# Patient Record
Sex: Female | Born: 1970 | Race: Black or African American | Hispanic: No | Marital: Married | Smoking: Never smoker
Health system: Southern US, Community
[De-identification: ages and names within clinical notes are randomized; demographics above are authoritative.]

## PROBLEM LIST (undated history)

## (undated) DIAGNOSIS — N200 Calculus of kidney: Secondary | ICD-10-CM

## (undated) DIAGNOSIS — R51 Headache: Secondary | ICD-10-CM

## (undated) DIAGNOSIS — N809 Endometriosis, unspecified: Secondary | ICD-10-CM

## (undated) DIAGNOSIS — K5900 Constipation, unspecified: Secondary | ICD-10-CM

## (undated) DIAGNOSIS — Z87442 Personal history of urinary calculi: Secondary | ICD-10-CM

## (undated) DIAGNOSIS — R519 Headache, unspecified: Secondary | ICD-10-CM

## (undated) DIAGNOSIS — E049 Nontoxic goiter, unspecified: Secondary | ICD-10-CM

## (undated) DIAGNOSIS — K635 Polyp of colon: Secondary | ICD-10-CM

## (undated) HISTORY — PX: ABDOMINAL HYSTERECTOMY: SHX81

## (undated) HISTORY — PX: FOOT SURGERY: SHX648

---
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
## 2005-09-18 ENCOUNTER — Ambulatory Visit: Payer: Self-pay | Admitting: Family Medicine

## 2006-10-01 ENCOUNTER — Ambulatory Visit: Payer: Self-pay | Admitting: Family Medicine

## 2007-04-03 ENCOUNTER — Other Ambulatory Visit: Payer: Self-pay

## 2007-04-03 ENCOUNTER — Emergency Department: Payer: Self-pay | Admitting: Emergency Medicine

## 2007-10-08 ENCOUNTER — Ambulatory Visit: Payer: Self-pay | Admitting: Family Medicine

## 2009-09-12 ENCOUNTER — Ambulatory Visit: Payer: Self-pay | Admitting: Unknown Physician Specialty

## 2010-10-25 ENCOUNTER — Ambulatory Visit: Payer: Self-pay | Admitting: Internal Medicine

## 2010-10-30 ENCOUNTER — Ambulatory Visit: Payer: Self-pay | Admitting: Unknown Physician Specialty

## 2011-06-24 ENCOUNTER — Ambulatory Visit: Payer: Self-pay | Admitting: Gastroenterology

## 2011-06-27 LAB — PATHOLOGY REPORT

## 2013-09-06 ENCOUNTER — Ambulatory Visit: Payer: Self-pay | Admitting: Internal Medicine

## 2013-09-09 ENCOUNTER — Ambulatory Visit (INDEPENDENT_AMBULATORY_CARE_PROVIDER_SITE_OTHER): Payer: 59 | Admitting: Pulmonary Disease

## 2013-09-09 ENCOUNTER — Encounter: Payer: Self-pay | Admitting: Pulmonary Disease

## 2013-09-09 ENCOUNTER — Encounter (INDEPENDENT_AMBULATORY_CARE_PROVIDER_SITE_OTHER): Payer: Self-pay

## 2013-09-09 VITALS — BP 114/70 | HR 74 | Temp 98.2°F | Ht 64.0 in | Wt 144.2 lb

## 2013-09-09 DIAGNOSIS — R911 Solitary pulmonary nodule: Secondary | ICD-10-CM | POA: Insufficient documentation

## 2013-09-09 NOTE — Patient Instructions (Signed)
Repeat CT scan in 1 yr Call us for breathing issues or cough or wheezing

## 2013-09-09 NOTE — Assessment & Plan Note (Addendum)
Low risk for malignancy in this never smoker, but will need serial followup. Repeat CT scan in 1 yr @ [place] [facility] Call us for breathing issues or cough or wheezing Her dyspnea seems to be related more to anxiety and depression and recent stressors rather than a cardiopulmonary issue.

## 2013-09-09 NOTE — Progress Notes (Signed)
   Subjective:    Patient ID: Kathy Jackson, female    DOB: 1970-11-14, 42 y.o.   MRN: [id number]  HPI 42 year old never smoker presents for evaluation of finding of pulmonary nodule on CT chest. She reports dyspnea unrelated to exertion for the past few months. This lasts for a few minutes and is relieved by taking deep breaths and relaxing. She reports frequent heartburn and has been maintained on Nexium for 2 years. She reports significant stressors in her marital life and is on Remeron for sleep issues for the past week. Chest x-ray was obtained which suggested right-sided lung nodule. Hence CT chest without contrast was obtained which showed a 4 mm nodule in the left lower lobe. The nodule noted on the chest x-ray appears to be artifactual. She denies chest pain palpitations, tremors, prior or family history of venous thromboembolic. Is no history of wheezing or childhood history of asthma.  No past medical history on file.  Past Surgical History  Procedure Laterality Date  . Abdominal hysterectomy    . Foot surgery      No Known Allergies  History   Social History  . Marital Status: Unknown    Spouse Name: N/A    Number of Children: 2  . Years of Education: N/A   Occupational History  . revenue and billing    Social History Main Topics  . Smoking status: Never Smoker   . Smokeless tobacco: Not on file  . Alcohol Use: No  . Drug Use: No  . Sexual Activity: Not on file   Other Topics Concern  . Not on file   Social History Narrative  . No narrative on file    Family History  Problem Relation Age of Onset  . Breast cancer Mother   . Prostate cancer Brother       Review of Systems  Constitutional: Positive for activity change and unexpected weight change. Negative for fever.  HENT: Negative for congestion, dental problem, ear pain, nosebleeds, postnasal drip, rhinorrhea, sinus pressure, sneezing, sore throat and trouble swallowing.   Eyes: Negative for  redness and itching.  Respiratory: Positive for shortness of breath. Negative for cough, chest tightness and wheezing.   Cardiovascular: Negative for palpitations and leg swelling.  Gastrointestinal: Negative for nausea and vomiting.  Genitourinary: Negative for dysuria.  Musculoskeletal: Negative for joint swelling.  Skin: Negative for rash.  Neurological: Negative for headaches.  Hematological: Does not bruise/bleed easily.  Psychiatric/Behavioral: Positive for dysphoric mood. The patient is not nervous/anxious.        Objective:   Physical Exam  Gen. Pleasant, well-nourished, in no distress ENT - no lesions, no post nasal drip Neck: No JVD, no thyromegaly, no carotid bruits Lungs: no use of accessory muscles, no dullness to percussion, clear without rales or rhonchi  Cardiovascular: Rhythm regular, heart sounds  normal, no murmurs or gallops, no peripheral edema Musculoskeletal: No deformities, no cyanosis or clubbing        Assessment & Plan:

## 2014-07-20 ENCOUNTER — Telehealth: Payer: Self-pay | Admitting: Pulmonary Disease

## 2014-07-20 NOTE — Telephone Encounter (Signed)
Spoke with patient-she is aware that I will get message to our PCC's to schedule her CT scan-order in EPIC.

## 2014-07-21 NOTE — Telephone Encounter (Signed)
LMTCB Kathy Jackson ° °

## 2014-07-21 NOTE — Telephone Encounter (Signed)
[email or web address]  09/27/14@3pm  pt is aware Kathy Jackson

## 2014-09-09 ENCOUNTER — Ambulatory Visit: Payer: Self-pay

## 2014-09-20 ENCOUNTER — Ambulatory Visit (INDEPENDENT_AMBULATORY_CARE_PROVIDER_SITE_OTHER): Payer: 59 | Admitting: Pulmonary Disease

## 2014-09-20 ENCOUNTER — Encounter: Payer: Self-pay | Admitting: Pulmonary Disease

## 2014-09-20 VITALS — BP 120/78 | HR 64 | Ht 64.0 in | Wt 159.6 lb

## 2014-09-20 DIAGNOSIS — R911 Solitary pulmonary nodule: Secondary | ICD-10-CM

## 2014-09-20 NOTE — Assessment & Plan Note (Signed)
Nodule is stable FU CT in dec 2016  

## 2014-09-20 NOTE — Progress Notes (Signed)
   Subjective:    Patient ID: Steffanie RainwaterNakethia N Lewman, female    DOB: 04/19/1971, 43 y.o.   MRN: [id number]  HPI  43 year old never smoker presents for follow-up of 4 mm left lower lobe nodule noted in December 2014  Her dyspnea symptoms have subsided, her stressors and anxiety have gone away. Follow-up CT chest [facility] )shows unchanged nodule     Review of Systems neg for any significant sore throat, dysphagia, itching, sneezing, nasal congestion or excess/ purulent secretions, fever, chills, sweats, unintended wt loss, pleuritic or exertional cp, hempoptysis, orthopnea pnd or change in chronic leg swelling. Also denies presyncope, palpitations, heartburn, abdominal pain, nausea, vomiting, diarrhea or change in bowel or urinary habits, dysuria,hematuria, rash, arthralgias, visual complaints, headache, numbness weakness or ataxia.     Objective:   Physical Exam  Gen. Pleasant, well-nourished, in no distress ENT - no lesions, no post nasal drip Neck: No JVD, no thyromegaly, no carotid bruits Lungs: no use of accessory muscles, no dullness to percussion, clear without rales or rhonchi  Cardiovascular: Rhythm regular, heart sounds  normal, no murmurs or gallops, no peripheral edema Musculoskeletal: No deformities, no cyanosis or clubbing        Assessment & Plan:

## 2014-09-20 NOTE — Patient Instructions (Signed)
Nodule is stable FU CT in dec 2016

## 2014-09-27 ENCOUNTER — Other Ambulatory Visit: Payer: 59

## 2014-10-03 ENCOUNTER — Ambulatory Visit: Payer: 59 | Admitting: Pulmonary Disease

## 2014-10-06 ENCOUNTER — Other Ambulatory Visit: Payer: 59

## 2015-03-06 ENCOUNTER — Other Ambulatory Visit: Payer: Self-pay | Admitting: Internal Medicine

## 2015-03-06 ENCOUNTER — Ambulatory Visit
Admission: RE | Admit: 2015-03-06 | Discharge: 2015-03-06 | Disposition: A | Discharge status: Home / Self Care | Payer: 59 | Source: Ambulatory Visit | Attending: Internal Medicine | Admitting: Internal Medicine

## 2015-03-06 DIAGNOSIS — R0602 Shortness of breath: Secondary | ICD-10-CM

## 2015-03-06 DIAGNOSIS — R911 Solitary pulmonary nodule: Secondary | ICD-10-CM | POA: Diagnosis not present

## 2015-03-06 MED ORDER — IOHEXOL 350 MG/ML SOLN
75.0000 mL | Freq: Once | INTRAVENOUS | Status: AC | PRN
  Administered 2015-03-06: 75 mL via INTRAVENOUS

## 2015-11-08 ENCOUNTER — Ambulatory Visit (INDEPENDENT_AMBULATORY_CARE_PROVIDER_SITE_OTHER): Payer: 59 | Admitting: Pulmonary Disease

## 2015-11-08 ENCOUNTER — Encounter: Payer: Self-pay | Admitting: Pulmonary Disease

## 2015-11-08 VITALS — BP 120/64 | HR 76 | Ht 64.0 in | Wt 160.6 lb

## 2015-11-08 DIAGNOSIS — R911 Solitary pulmonary nodule: Secondary | ICD-10-CM

## 2015-11-08 NOTE — Progress Notes (Signed)
   Subjective:    Patient ID: Kathy Jackson, female    DOB: 1971-05-28, 45 y.o.   MRN: [id number]  HPI  45 year old never smoker presents for follow-up of 4 mm left lower lobe nodule noted in December 2014   11/08/2015  Chief Complaint  Patient presents with  . Follow-up    pt. states breathing is baseline.denies SOB, cough, wheezing or chest pain/tightness.   Her dyspnea symptoms have subsided, her stressors and anxiety have gone away. Follow-up CT chest [facility] ) 02/2015 showed unchanged nodule Reflux symptoms are controlled on Nexium  Review of Systems neg for any significant sore throat, dysphagia, itching, sneezing, nasal congestion or excess/ purulent secretions, fever, chills, sweats, unintended wt loss, pleuritic or exertional cp, hempoptysis, orthopnea pnd or change in chronic leg swelling. Also denies presyncope, palpitations, heartburn, abdominal pain, nausea, vomiting, diarrhea or change in bowel or urinary habits, dysuria,hematuria, rash, arthralgias, visual complaints, headache, numbness weakness or ataxia.     Objective:   Physical Exam  Gen. Pleasant, well-nourished, in no distress ENT - no lesions, no post nasal drip Neck: No JVD, no thyromegaly, no carotid bruits Lungs: no use of accessory muscles, no dullness to percussion, clear without rales or rhonchi  Cardiovascular: Rhythm regular, heart sounds  normal, no murmurs or gallops, no peripheral edema Musculoskeletal: No deformities, no cyanosis or clubbing        Assessment & Plan:

## 2015-11-08 NOTE — Assessment & Plan Note (Signed)
Schedule CT chest - to follow-up nodule Favor benign etiology  Greater than 50% time was spent in counseling and coordination of care with the patient

## 2015-11-08 NOTE — Patient Instructions (Signed)
Ct chest will be scheduled at [facility]

## 2015-11-13 ENCOUNTER — Ambulatory Visit
Admission: RE | Admit: 2015-11-13 | Discharge: 2015-11-13 | Disposition: A | Discharge status: Home / Self Care | Payer: 59 | Source: Ambulatory Visit | Attending: Pulmonary Disease | Admitting: Pulmonary Disease

## 2015-11-13 DIAGNOSIS — R911 Solitary pulmonary nodule: Secondary | ICD-10-CM

## 2015-12-22 ENCOUNTER — Other Ambulatory Visit: Payer: Self-pay | Admitting: Internal Medicine

## 2015-12-22 DIAGNOSIS — R0789 Other chest pain: Secondary | ICD-10-CM

## 2016-01-03 ENCOUNTER — Ambulatory Visit
Admission: RE | Admit: 2016-01-03 | Discharge: 2016-01-03 | Disposition: A | Discharge status: Home / Self Care | Payer: 59 | Source: Ambulatory Visit | Attending: Internal Medicine | Admitting: Internal Medicine

## 2016-01-03 DIAGNOSIS — K219 Gastro-esophageal reflux disease without esophagitis: Secondary | ICD-10-CM | POA: Diagnosis not present

## 2016-01-03 DIAGNOSIS — R0789 Other chest pain: Secondary | ICD-10-CM | POA: Diagnosis not present

## 2016-02-01 ENCOUNTER — Other Ambulatory Visit: Payer: Self-pay | Admitting: Unknown Physician Specialty

## 2016-02-01 DIAGNOSIS — Z1231 Encounter for screening mammogram for malignant neoplasm of breast: Secondary | ICD-10-CM

## 2016-02-08 ENCOUNTER — Ambulatory Visit: Payer: 59

## 2016-03-12 ENCOUNTER — Ambulatory Visit: Payer: 59

## 2016-03-15 ENCOUNTER — Ambulatory Visit
Admission: RE | Admit: 2016-03-15 | Discharge: 2016-03-15 | Disposition: A | Discharge status: Home / Self Care | Payer: 59 | Source: Ambulatory Visit | Attending: Unknown Physician Specialty | Admitting: Unknown Physician Specialty

## 2016-03-15 ENCOUNTER — Other Ambulatory Visit: Payer: Self-pay | Admitting: Unknown Physician Specialty

## 2016-03-15 DIAGNOSIS — Z1231 Encounter for screening mammogram for malignant neoplasm of breast: Secondary | ICD-10-CM | POA: Diagnosis present

## 2016-06-13 ENCOUNTER — Encounter: Payer: Self-pay | Admitting: *Deleted

## 2016-06-14 ENCOUNTER — Encounter: Payer: Self-pay | Admitting: Anesthesiology

## 2016-06-14 ENCOUNTER — Encounter
Admission: RE | Disposition: A | Discharge status: Home / Self Care | Payer: Self-pay | Source: Ambulatory Visit | Attending: Gastroenterology

## 2016-06-14 ENCOUNTER — Ambulatory Visit: Payer: 59 | Admitting: Anesthesiology

## 2016-06-14 ENCOUNTER — Ambulatory Visit
Admission: RE | Admit: 2016-06-14 | Discharge: 2016-06-14 | Disposition: A | Discharge status: Home / Self Care | Payer: 59 | Source: Ambulatory Visit | Attending: Gastroenterology | Admitting: Gastroenterology

## 2016-06-14 DIAGNOSIS — K2210 Ulcer of esophagus without bleeding: Secondary | ICD-10-CM | POA: Diagnosis not present

## 2016-06-14 DIAGNOSIS — K210 Gastro-esophageal reflux disease with esophagitis: Secondary | ICD-10-CM | POA: Insufficient documentation

## 2016-06-14 DIAGNOSIS — Z79899 Other long term (current) drug therapy: Secondary | ICD-10-CM | POA: Diagnosis not present

## 2016-06-14 DIAGNOSIS — E049 Nontoxic goiter, unspecified: Secondary | ICD-10-CM | POA: Diagnosis not present

## 2016-06-14 DIAGNOSIS — K2950 Unspecified chronic gastritis without bleeding: Secondary | ICD-10-CM | POA: Insufficient documentation

## 2016-06-14 DIAGNOSIS — Z87442 Personal history of urinary calculi: Secondary | ICD-10-CM | POA: Diagnosis not present

## 2016-06-14 HISTORY — DX: Headache: R51

## 2016-06-14 HISTORY — PX: ESOPHAGOGASTRODUODENOSCOPY (EGD) WITH PROPOFOL: SHX5813

## 2016-06-14 HISTORY — DX: Calculus of kidney: N20.0

## 2016-06-14 HISTORY — DX: Nontoxic goiter, unspecified: E04.9

## 2016-06-14 HISTORY — DX: Headache, unspecified: R51.9

## 2016-06-14 HISTORY — DX: Endometriosis, unspecified: N80.9

## 2016-06-14 HISTORY — DX: Constipation, unspecified: K59.00

## 2016-06-14 SURGERY — ESOPHAGOGASTRODUODENOSCOPY (EGD) WITH PROPOFOL
Anesthesia: General

## 2016-06-14 MED ORDER — SODIUM CHLORIDE 0.9 % IV SOLN
INTRAVENOUS | Status: DC
  Administered 2016-06-14: 1000 mL via INTRAVENOUS

## 2016-06-14 MED ORDER — PROPOFOL 500 MG/50ML IV EMUL
INTRAVENOUS | Status: DC | PRN
  Administered 2016-06-14: 140 ug/kg/min via INTRAVENOUS

## 2016-06-14 MED ORDER — FENTANYL CITRATE (PF) 100 MCG/2ML IJ SOLN
INTRAMUSCULAR | Status: DC | PRN
  Administered 2016-06-14: 50 ug via INTRAVENOUS

## 2016-06-14 MED ORDER — MIDAZOLAM HCL 5 MG/5ML IJ SOLN
INTRAMUSCULAR | Status: DC | PRN
  Administered 2016-06-14: 1 mg via INTRAVENOUS

## 2016-06-14 MED ORDER — PROPOFOL 10 MG/ML IV BOLUS
INTRAVENOUS | Status: DC | PRN
  Administered 2016-06-14: 50 mg via INTRAVENOUS

## 2016-06-14 MED ORDER — SODIUM CHLORIDE 0.9 % IV SOLN: INTRAVENOUS | Status: DC

## 2016-06-14 NOTE — H&P (Signed)
Outpatient short stay form Pre-procedure 06/14/2016 10:55 AM Christena DeemMartin U Zuleyka Kloc MD  Primary Physician: Dr. Aram BeechamJeffrey Sparks  Reason for visit:  EGD  History of present illness:  Patient is a 45 year old female presenting today as above. She has a long history of heartburn and reflux issues. She states that times she will reflux at night and awaken with material on the pillow. She was previously taking Nexium which did not seem to help her symptoms. She is now on Protonix 40 mg twice a day with relatively good symptom resolution. She is presenting today for evaluation. She denies dysphagia. She did have a upper GI with small bowel series on 01/03/2016 the showing a severe gastroesophageal reflux and a normal small bowel evaluation. She denies use of aspirin products. She does take an occasional Aleve. She takes no blood thinning agents.    Current Facility-Administered Medications:  .  0.9 %  sodium chloride infusion, , Intravenous, Continuous, Christena DeemMartin U Micheline Markes, MD, Last Rate: 20 mL/hr at 06/14/16 1028, 1,000 mL at 06/14/16 1028 .  0.9 %  sodium chloride infusion, , Intravenous, Continuous, Christena DeemMartin U Emnet Monk, MD  Prescriptions Prior to Admission  Medication Sig Dispense Refill Last Dose  . Cholecalciferol (VITAMIN D-3) 5000 UNITS TABS Take by mouth daily.   Past Week at Unknown time  . esomeprazole (NEXIUM) 40 MG capsule Take 40 mg by mouth daily at 12 noon.   Not Taking at Unknown time  . pantoprazole (PROTONIX) 40 MG tablet Take 40 mg by mouth 2 (two) times daily.   Not Taking at Unknown time  . phentermine 37.5 MG capsule Take 37.5 mg by mouth every morning.   Not Taking at Unknown time     No Known Allergies   Past Medical History:  Diagnosis Date  . Constipation   . Endometriosis   . Goiter   . Headache   . Nephrolithiasis     Review of systems:      Physical Exam    Heart and lungs: Regular rate and rhythm without rub or gallop, lungs are bilaterally clear.    HEENT:  Septic atraumatic eyes are anicteric    Other:     Pertinant exam for procedure: Soft nontender nondistended bowel sounds positive normoactive.    Planned proceedures: EGD and indicated procedures. I have discussed the risks benefits and complications of procedures to include not limited to bleeding, infection, perforation and the risk of sedation and the patient wishes to proceed.    Christena DeemMartin U Kourtlyn Charlet, MD Gastroenterology 06/14/2016  10:55 AM

## 2016-06-14 NOTE — Anesthesia Postprocedure Evaluation (Signed)
Anesthesia Post Note  Patient: Steffanie Rainwaterakethia N Melman  Procedure(s) Performed: Procedure(s) (LRB): ESOPHAGOGASTRODUODENOSCOPY (EGD) WITH PROPOFOL (N/A)  Patient location during evaluation: Endoscopy Anesthesia Type: General Level of consciousness: awake and alert Pain management: pain level controlled Vital Signs Assessment: post-procedure vital signs reviewed and stable Respiratory status: spontaneous breathing and respiratory function stable Cardiovascular status: stable Anesthetic complications: no    Last Vitals:  Vitals:   06/14/16 1129 06/14/16 1139  BP: 107/60 111/75  Pulse: 72   Resp: 15   Temp:      Last Pain:  Vitals:   06/14/16 1119  TempSrc: Tympanic                 KEPHART,WILLIAM K

## 2016-06-14 NOTE — Anesthesia Preprocedure Evaluation (Signed)
Anesthesia Evaluation  Patient identified by MRN, date of birth, ID band Patient awake    Reviewed: Allergy & Precautions, H&P , NPO status , Patient's Chart, lab work & pertinent test results, reviewed documented beta blocker date and time   History of Anesthesia Complications Negative for: history of anesthetic complications  Airway Mallampati: I  TM Distance: >3 FB Neck ROM: full    Dental no notable dental hx.    Pulmonary neg pulmonary ROS,    Pulmonary exam normal breath sounds clear to auscultation       Cardiovascular Exercise Tolerance: Good negative cardio ROS Normal cardiovascular exam Rhythm:regular Rate:Normal     Neuro/Psych negative neurological ROS  negative psych ROS   GI/Hepatic Neg liver ROS, GERD  ,  Endo/Other  negative endocrine ROS  Renal/GU Renal disease (kidney stones)  negative genitourinary   Musculoskeletal   Abdominal   Peds  Hematology negative hematology ROS (+)   Anesthesia Other Findings Past Medical History: No date: Constipation No date: Endometriosis No date: Goiter No date: Headache No date: Nephrolithiasis   Reproductive/Obstetrics negative OB ROS                             Anesthesia Physical Anesthesia Plan  ASA: II  Anesthesia Plan: General   Post-op Pain Management:    Induction:   Airway Management Planned:   Additional Equipment:   Intra-op Plan:   Post-operative Plan:   Informed Consent: I have reviewed the patients History and Physical, chart, labs and discussed the procedure including the risks, benefits and alternatives for the proposed anesthesia with the patient or authorized representative who has indicated his/her understanding and acceptance.   Dental Advisory Given  Plan Discussed with: Anesthesiologist, CRNA and Surgeon  Anesthesia Plan Comments:         Anesthesia Quick Evaluation

## 2016-06-14 NOTE — Op Note (Signed)
[facility] Gastroenterology Patient Name: Kathy Jackson Procedure Date: 06/14/2016 10:53 AM MRN: [id number] Account #: [id number] Date of Birth: Feb 01, 1971 Admit Type: Outpatient Age: 45 Room: [facility] ENDO ROOM 3 Gender: Female Note Status: Finalized Procedure:            Upper GI endoscopy Indications:          Epigastric abdominal pain, Gastro-esophageal reflux                        disease Providers:            Christena Deem, MD Referring MD:         Duane Lope. Judithann Sheen, MD (Referring MD) Medicines:            Monitored Anesthesia Care Complications:        No immediate complications. Procedure:            Pre-Anesthesia Assessment:                       - ASA Grade Assessment: II - A patient with mild                        systemic disease.                       After obtaining informed consent, the endoscope was                        passed under direct vision. Throughout the procedure,                        the patient's blood pressure, pulse, and oxygen                        saturations were monitored continuously. The Endoscope                        was introduced through the mouth, and advanced to the                        third part of duodenum. The upper GI endoscopy was                        accomplished without difficulty. The patient tolerated                        the procedure well. Findings:      LA Grade A (one or more mucosal breaks less than 5 mm, not extending       between tops of 2 mucosal folds) esophagitis with no bleeding was found.       Biopsies were taken with a cold forceps for histology.      The exam of the esophagus was otherwise normal.      Patchy mild inflammation characterized by erosions and erythema was       found in the gastric antrum. Biopsies were taken with a cold forceps for       histology.      The cardia and gastric fundus were normal on retroflexion.      The examined duodenum was normal.      The  exam was otherwise without abnormality.  Impression:           - LA Grade A erosive esophagitis. Biopsied.                       - Erosive gastritis. Biopsied.                       - Normal examined duodenum.                       - The examination was otherwise normal. Recommendation:       - Await pathology results.                       - Continue present medications.                       - Return to GI clinic in 1 month.                       - consider abdominal ultrasound/RUQ with HIDA/CCK for                        continued symptoms. Procedure Code(s):    --- Professional ---                       [phone], Esophagogastroduodenoscopy, flexible, transoral;                        with biopsy, single or multiple Diagnosis Code(s):    --- Professional ---                       K20.8, Other esophagitis                       K29.60, Other gastritis without bleeding                       R10.13, Epigastric pain                       K21.9, Gastro-esophageal reflux disease without                        esophagitis CPT copyright 2016 American Medical Association. All rights reserved. The codes documented in this report are preliminary and upon coder review may  be revised to meet current compliance requirements. Christena DeemMartin U Skulskie, MD 06/14/2016 11:20:38 AM This report has been signed electronically. Number of Addenda: 0 Note Initiated On: 06/14/2016 10:53 AM      [facility]

## 2016-06-14 NOTE — Anesthesia Procedure Notes (Signed)
Date/Time: 06/14/2016 11:01 AM Performed by: Derinda LateIACONE, Franklin Baumbach Pre-anesthesia Checklist: Timeout performed, Patient being monitored, Suction available, Emergency Drugs available and Patient identified Patient Re-evaluated:Patient Re-evaluated prior to inductionOxygen Delivery Method: Nasal cannula Intubation Type: IV induction Placement Confirmation: positive ETCO2

## 2016-06-14 NOTE — Transfer of Care (Signed)
Immediate Anesthesia Transfer of Care Note  Patient: Kathy Jackson  Procedure(s) Performed: Procedure(s): ESOPHAGOGASTRODUODENOSCOPY (EGD) WITH PROPOFOL (N/A)  Patient Location: PACU and Endoscopy Unit  Anesthesia Type:General  Level of Consciousness: awake, alert  and oriented  Airway & Oxygen Therapy: Patient Spontanous Breathing and Patient connected to nasal cannula oxygen  Post-op Assessment: Report given to RN and Post -op Vital signs reviewed and stable  Post vital signs: Reviewed  Last Vitals:  Vitals:   06/14/16 1018 06/14/16 1119  BP: (!) 114/36   Pulse: 62   Resp: 16   Temp: 36.6 C (!) 35.8 C    Last Pain:  Vitals:   06/14/16 1119  TempSrc: Tympanic         Complications: No apparent anesthesia complications

## 2016-06-17 ENCOUNTER — Encounter: Payer: Self-pay | Admitting: Gastroenterology

## 2016-06-17 LAB — SURGICAL PATHOLOGY

## 2016-08-21 ENCOUNTER — Telehealth: Payer: Self-pay | Admitting: Pulmonary Disease

## 2016-08-21 NOTE — Telephone Encounter (Signed)
No further follow up required.

## 2016-08-21 NOTE — Telephone Encounter (Signed)
Pt called asking about whether or not she needs another f/u ct chest  I advised that it looks like her scan in Feb 2017 should be the last one, since it was stable for 3 yrs and likely benign so no scan should be needed now I advised will double check with RA, and if he states otherwise, we will let her know  RA, please advise thanks!

## 2016-08-21 NOTE — Telephone Encounter (Signed)
Called and spoke to pt. Informed her of the recs per RA. Pt verbalized understanding and denied any further questions or concerns at this time.   

## 2016-08-26 ENCOUNTER — Ambulatory Visit: Payer: 59

## 2016-11-07 ENCOUNTER — Ambulatory Visit: Payer: 59 | Admitting: Pulmonary Disease

## 2016-11-07 ENCOUNTER — Encounter: Payer: Self-pay | Admitting: *Deleted

## 2016-11-07 ENCOUNTER — Emergency Department: Payer: 59

## 2016-11-07 ENCOUNTER — Emergency Department
Admission: EM | Admit: 2016-11-07 | Discharge: 2016-11-07 | Disposition: A | Discharge status: Home / Self Care | Payer: 59 | Attending: Emergency Medicine | Admitting: Emergency Medicine

## 2016-11-07 DIAGNOSIS — R0602 Shortness of breath: Secondary | ICD-10-CM | POA: Insufficient documentation

## 2016-11-07 DIAGNOSIS — Z79899 Other long term (current) drug therapy: Secondary | ICD-10-CM | POA: Diagnosis not present

## 2016-11-07 DIAGNOSIS — R0789 Other chest pain: Secondary | ICD-10-CM | POA: Diagnosis not present

## 2016-11-07 DIAGNOSIS — R42 Dizziness and giddiness: Secondary | ICD-10-CM | POA: Insufficient documentation

## 2016-11-07 LAB — CBC
HCT: 37.2 % (ref 35.0–47.0)
Hemoglobin: 11.9 g/dL — ABNORMAL LOW (ref 12.0–16.0)
MCH: 23.7 pg — ABNORMAL LOW (ref 26.0–34.0)
MCHC: 32.1 g/dL (ref 32.0–36.0)
MCV: 73.9 fL — ABNORMAL LOW (ref 80.0–100.0)
PLATELETS: 256 10*3/uL (ref 150–440)
RBC: 5.04 MIL/uL (ref 3.80–5.20)
RDW: 17.5 % — ABNORMAL HIGH (ref 11.5–14.5)
WBC: 15.5 10*3/uL — AB (ref 3.6–11.0)

## 2016-11-07 LAB — BASIC METABOLIC PANEL
Anion gap: 10 (ref 5–15)
BUN: 20 mg/dL (ref 6–20)
CHLORIDE: 102 mmol/L (ref 101–111)
CO2: 25 mmol/L (ref 22–32)
CREATININE: 1 mg/dL (ref 0.44–1.00)
Calcium: 9 mg/dL (ref 8.9–10.3)
Glucose, Bld: 91 mg/dL (ref 65–99)
Potassium: 2.8 mmol/L — ABNORMAL LOW (ref 3.5–5.1)
SODIUM: 137 mmol/L (ref 135–145)

## 2016-11-07 LAB — TROPONIN I

## 2016-11-07 MED ORDER — IPRATROPIUM-ALBUTEROL 0.5-2.5 (3) MG/3ML IN SOLN
3.0000 mL | Freq: Once | RESPIRATORY_TRACT | Status: AC
Start: 2016-11-07 — End: 2016-11-07
  Administered 2016-11-07: 3 mL via RESPIRATORY_TRACT
  Filled 2016-11-07: qty 3

## 2016-11-07 MED ORDER — ALBUTEROL SULFATE HFA 108 (90 BASE) MCG/ACT IN AERS: 2.0000 | INHALATION_SPRAY | Freq: Four times a day (QID) | RESPIRATORY_TRACT | 0 refills | Status: AC | PRN

## 2016-11-07 MED ORDER — POTASSIUM CHLORIDE CRYS ER 20 MEQ PO TBCR
40.0000 meq | EXTENDED_RELEASE_TABLET | Freq: Once | ORAL | Status: AC
  Administered 2016-11-07: 40 meq via ORAL
  Filled 2016-11-07: qty 2

## 2016-11-07 NOTE — ED Notes (Signed)
ED Provider at bedside. 

## 2016-11-07 NOTE — ED Triage Notes (Signed)
States SOB, states swelling in her legs and feet, states she is currently on predisone for a chest cold, pt awake and alert in no acute distress

## 2016-11-07 NOTE — ED Provider Notes (Signed)
[facility] Emergency Department Provider Note  ____________________________________________   I have reviewed the triage vital signs and the nursing notes.   HISTORY  Chief Complaint Shortness of Breath   History limited by: Not Limited   HPI Kathy Jackson is a 46 y.o. female who presents to the emergency department today because of concerns for shortness of breath, chest pressure and lightheadedness. Patient states that she is being treated by her primary care doctor for chest congestion. This includes being placed on now her second round of steroids as well as doxycycline. Patient states today while she is driving she started feeling a little lightheaded. She felt like the shortness of breath was a little worse. In addition the patient felt lightheaded. She states that by the time of my evaluation the lightheadedness had improved. She is also complaining of swelling in her legs.   Past Medical History:  Diagnosis Date  . Constipation   . Endometriosis   . Goiter   . Headache   . Nephrolithiasis     Patient Active Problem List   Diagnosis Date Noted  . Solitary pulmonary nodule 09/09/2013    Past Surgical History:  Procedure Laterality Date  . ABDOMINAL HYSTERECTOMY    . ESOPHAGOGASTRODUODENOSCOPY (EGD) WITH PROPOFOL N/A 06/14/2016   Procedure: ESOPHAGOGASTRODUODENOSCOPY (EGD) WITH PROPOFOL;  Surgeon: Christena Deem, MD;  Location: [facility] ENDOSCOPY;  Service: Endoscopy;  Laterality: N/A;  . FOOT SURGERY      Prior to Admission medications   Medication Sig Start Date End Date Taking? Authorizing Provider  Cholecalciferol (VITAMIN D-3) 5000 UNITS TABS Take by mouth daily.    Historical Provider, MD  esomeprazole (NEXIUM) 40 MG capsule Take 40 mg by mouth daily at 12 noon.    Historical Provider, MD  pantoprazole (PROTONIX) 40 MG tablet Take 40 mg by mouth 2 (two) times daily.    Historical Provider, MD  phentermine 37.5 MG capsule Take 37.5  mg by mouth every morning.    Historical Provider, MD    Allergies Patient has no known allergies.  Family History  Problem Relation Age of Onset  . Breast cancer Mother   . Prostate cancer Brother     Social History Social History  Substance Use Topics  . Smoking status: Never Smoker  . Smokeless tobacco: Never Used  . Alcohol use No    Review of Systems  Constitutional: Negative for fever. Cardiovascular: Positive for chest pain. Respiratory: Positive for shortness of breath. Gastrointestinal: Negative for abdominal pain, vomiting and diarrhea. Genitourinary: Negative for dysuria. Musculoskeletal: Negative for back pain. Positive for leg swelling. Skin: Negative for rash. Neurological: Negative for headaches, focal weakness or numbness.  10-point ROS otherwise negative.  ____________________________________________   PHYSICAL EXAM:  VITAL SIGNS: ED Triage Vitals [11/07/16 1734]  Enc Vitals Group     BP 131/64     Pulse Rate 66     Resp 18     Temp 98.9 F (37.2 C)     Temp Source Oral     SpO2 100 %     Weight 154 lb (69.9 kg)     Height 5\' 4"  (1.626 m)     Head Circumference     Constitutional: Alert and oriented. Well appearing and in no distress. Eyes: Conjunctivae are normal. Normal extraocular movements. ENT   Head: Normocephalic and atraumatic.   Nose: No congestion/rhinnorhea.   Mouth/Throat: Mucous membranes are moist.   Neck: No stridor. Hematological/Lymphatic/Immunilogical: No cervical lymphadenopathy. Cardiovascular: Normal rate, regular  rhythm.  No murmurs, rubs, or gallops. Respiratory: Normal respiratory effort without tachypnea nor retractions. Breath sounds are clear and equal bilaterally. No wheezes/rales/rhonchi. Gastrointestinal: Soft and non tender. No rebound. No guarding.  Genitourinary: Deferred Musculoskeletal: Normal range of motion in all extremities. No lower extremity edema. Neurologic:  Normal speech and  language. No gross focal neurologic deficits are appreciated.  Skin:  Skin is warm, dry and intact. No rash noted. Psychiatric: Mood and affect are normal. Speech and behavior are normal. Patient exhibits appropriate insight and judgment.  ____________________________________________    LABS (pertinent positives/negatives)  Labs Reviewed  BASIC METABOLIC PANEL - Abnormal; Notable for the following:       Result Value   Potassium 2.8 (*)    All other components within normal limits  CBC - Abnormal; Notable for the following:    WBC 15.5 (*)    Hemoglobin 11.9 (*)    MCV 73.9 (*)    MCH 23.7 (*)    RDW 17.5 (*)    All other components within normal limits  TROPONIN I    ____________________________________________   EKG  I, Phineas SemenGraydon Shawnique Mariotti, attending physician, personally viewed and interpreted this EKG  EKG Time: 1740 Rate: 65 Rhythm: normal sinus rhythm Axis: normal Intervals: qtc 422 QRS: incomplete RBBB ST changes: no st elevation Impression: abnormal ekg   ____________________________________________    RADIOLOGY  CXR IMPRESSION: No active cardiopulmonary disease.   ____________________________________________   PROCEDURES  Procedures  ____________________________________________   INITIAL IMPRESSION / ASSESSMENT AND PLAN / ED COURSE  Pertinent labs & imaging results that were available during my care of the patient were reviewed by me and considered in my medical decision making (see chart for details).  She presented to the emergency department today with concerns for some lower extremity edema, lightheadedness shortness breath and chest pain. Patient is being treated for chest cold with steroids. Think that likely explains some of the swelling. Patient was given duoneb treatment which she stated helped with her symptoms. Will plan on discharging with albuterol inhaler.   ____________________________________________   FINAL CLINICAL  IMPRESSION(S) / ED DIAGNOSES  Final diagnoses:  SOB (shortness of breath)     Note: This dictation was prepared with Dragon dictation. Any transcriptional errors that result from this process are unintentional     Phineas SemenGraydon Sheketa Ende, MD 11/07/16 2109

## 2016-11-07 NOTE — Discharge Instructions (Signed)
Please seek medical attention for any high fevers, chest pain, shortness of breath, change in behavior, persistent vomiting, bloody stool or any other new or concerning symptoms.  

## 2016-11-15 ENCOUNTER — Ambulatory Visit: Payer: 59 | Admitting: Pulmonary Disease

## 2016-11-21 DIAGNOSIS — K635 Polyp of colon: Secondary | ICD-10-CM

## 2016-11-21 HISTORY — DX: Polyp of colon: K63.5

## 2016-11-27 ENCOUNTER — Encounter: Payer: Self-pay | Admitting: Pulmonary Disease

## 2016-11-27 ENCOUNTER — Ambulatory Visit (INDEPENDENT_AMBULATORY_CARE_PROVIDER_SITE_OTHER): Payer: 59 | Admitting: Pulmonary Disease

## 2016-11-27 DIAGNOSIS — R911 Solitary pulmonary nodule: Secondary | ICD-10-CM | POA: Diagnosis not present

## 2016-11-27 DIAGNOSIS — J209 Acute bronchitis, unspecified: Secondary | ICD-10-CM | POA: Diagnosis not present

## 2016-11-27 NOTE — Assessment & Plan Note (Signed)
She does not have a history of asthma. Current symptoms seem to have been related to an acute attack of bronchitis which is now resolved She  can stop taking Symbicort

## 2016-11-27 NOTE — Progress Notes (Signed)
   Subjective:    Patient ID: Kathy Jackson, female    DOB: 09/10/1971, 46 y.o.   MRN: [id number]  HPI  46 year old never smoker  for follow-up of 4 mm left lower lobe nodule noted in December 2014 She works in the coding department [employer]  11/27/2016  Chief Complaint  Patient presents with  . Follow-up    1 year follow up.    She had a chest cold in February and went through 2 rounds of prednisone. She developed acute shortness of breath while driving in mid February and had to go to [facility] ED where she was given duo nebs with relief. She was then prescribed Symbicort by her PCP which she is taking now. Her cough and wheezing has subsided  She denies childhood history of asthma, she is a lifetime never smoker She has a lot of anxiety and stressors at one time but this is better although somewhat ongoing. Reflux symptoms are well controlled on Protonix I personally reviewed chest x-ray from 10/2016 which does not show any nodules    Review of Systems neg for any significant sore throat, dysphagia, itching, sneezing, nasal congestion or excess/ purulent secretions, fever, chills, sweats, unintended wt loss, pleuritic or exertional cp, hempoptysis, orthopnea pnd or change in chronic leg swelling. Also denies presyncope, palpitations, heartburn, abdominal pain, nausea, vomiting, diarrhea or change in bowel or urinary habits, dysuria,hematuria, rash, arthralgias, visual complaints, headache, numbness weakness or ataxia.     Objective:   Physical Exam  Gen. Pleasant, well-nourished, in no distress ENT - no lesions, no post nasal drip Neck: No JVD, no thyromegaly, no carotid bruits Lungs: no use of accessory muscles, no dullness to percussion, clear without rales or rhonchi  Cardiovascular: Rhythm regular, heart sounds  normal, no murmurs or gallops, no peripheral edema Musculoskeletal: No deformities, no cyanosis or clubbing         Assessment & Plan:

## 2016-11-27 NOTE — Assessment & Plan Note (Signed)
Nodule has been stable and is benign and does not need more follow-up

## 2016-11-27 NOTE — Patient Instructions (Signed)
Nodule has been stable and is benign and does not need more follow-up  You can stop taking Symbicort after you're done with your current inhaler  Call us as needed

## 2016-12-12 ENCOUNTER — Encounter: Payer: Self-pay | Admitting: *Deleted

## 2016-12-13 ENCOUNTER — Encounter
Admission: RE | Disposition: A | Discharge status: Home / Self Care | Payer: Self-pay | Source: Ambulatory Visit | Attending: Gastroenterology

## 2016-12-13 ENCOUNTER — Ambulatory Visit
Admission: RE | Admit: 2016-12-13 | Discharge: 2016-12-13 | Disposition: A | Discharge status: Home / Self Care | Payer: 59 | Source: Ambulatory Visit | Attending: Gastroenterology | Admitting: Gastroenterology

## 2016-12-13 ENCOUNTER — Ambulatory Visit: Payer: 59 | Admitting: *Deleted

## 2016-12-13 ENCOUNTER — Encounter: Payer: Self-pay | Admitting: *Deleted

## 2016-12-13 DIAGNOSIS — Z1211 Encounter for screening for malignant neoplasm of colon: Secondary | ICD-10-CM | POA: Insufficient documentation

## 2016-12-13 DIAGNOSIS — K635 Polyp of colon: Secondary | ICD-10-CM | POA: Insufficient documentation

## 2016-12-13 DIAGNOSIS — Z8371 Family history of colonic polyps: Secondary | ICD-10-CM | POA: Insufficient documentation

## 2016-12-13 DIAGNOSIS — K5730 Diverticulosis of large intestine without perforation or abscess without bleeding: Secondary | ICD-10-CM | POA: Diagnosis not present

## 2016-12-13 DIAGNOSIS — Z79899 Other long term (current) drug therapy: Secondary | ICD-10-CM | POA: Diagnosis not present

## 2016-12-13 HISTORY — DX: Personal history of urinary calculi: Z87.442

## 2016-12-13 HISTORY — PX: COLONOSCOPY WITH PROPOFOL: SHX5780

## 2016-12-13 SURGERY — COLONOSCOPY WITH PROPOFOL
Anesthesia: General

## 2016-12-13 MED ORDER — SODIUM CHLORIDE 0.9 % IV SOLN: INTRAVENOUS | Status: DC

## 2016-12-13 MED ORDER — SODIUM CHLORIDE 0.9 % IV SOLN
INTRAVENOUS | Status: DC
  Administered 2016-12-13: 10:00:00 via INTRAVENOUS

## 2016-12-13 MED ORDER — PROPOFOL 10 MG/ML IV BOLUS
INTRAVENOUS | Status: AC
  Filled 2016-12-13: qty 20

## 2016-12-13 MED ORDER — PROPOFOL 500 MG/50ML IV EMUL
INTRAVENOUS | Status: DC | PRN
  Administered 2016-12-13: 150 ug/kg/min via INTRAVENOUS

## 2016-12-13 MED ORDER — EPHEDRINE SULFATE 50 MG/ML IJ SOLN
INTRAMUSCULAR | Status: DC | PRN
  Administered 2016-12-13: 10 mg via INTRAVENOUS

## 2016-12-13 MED ORDER — PROPOFOL 500 MG/50ML IV EMUL
INTRAVENOUS | Status: AC
  Filled 2016-12-13: qty 50

## 2016-12-13 NOTE — Anesthesia Preprocedure Evaluation (Signed)
Anesthesia Evaluation  Patient identified by MRN, date of birth, ID band Patient awake    Reviewed: Allergy & Precautions, H&P , NPO status , Patient's Chart, lab work & pertinent test results, reviewed documented beta blocker date and time   Airway Mallampati: II   Neck ROM: full    Dental  (+) Teeth Intact   Pulmonary neg pulmonary ROS, neg shortness of breath, asthma ,    Pulmonary exam normal        Cardiovascular Exercise Tolerance: Good (-) CAD negative cardio ROS Normal cardiovascular exam Rhythm:regular Rate:Normal  Able to walk a mile without difficulty.  Has a EST pending as part of a workup secondary to a recent bout of dyspnea that is being treated as asthma and is without recurrence since starting her inhalers.  JA   Neuro/Psych  Headaches, negative neurological ROS  negative psych ROS   GI/Hepatic negative GI ROS, Neg liver ROS,   Endo/Other  negative endocrine ROS  Renal/GU Renal diseasenegative Renal ROS  negative genitourinary   Musculoskeletal   Abdominal   Peds  Hematology negative hematology ROS (+)   Anesthesia Other Findings Past Medical History: No date: Constipation No date: Endometriosis No date: Goiter No date: Headache     Comment: Migraines No date: History of kidney stones No date: Nephrolithiasis Past Surgical History: No date: ABDOMINAL HYSTERECTOMY 06/14/2016: ESOPHAGOGASTRODUODENOSCOPY (EGD) WITH PROPOFOL N/A     Comment: Procedure: ESOPHAGOGASTRODUODENOSCOPY (EGD)               WITH PROPOFOL;  Surgeon: Christena DeemMartin U Skulskie, MD;              Location: [facility] ENDOSCOPY;  Service: Endoscopy;               Laterality: N/A; No date: FOOT SURGERY BMI    Body Mass Index:  30.36 kg/m     Reproductive/Obstetrics negative OB ROS                             Anesthesia Physical Anesthesia Plan  ASA: II  Anesthesia Plan: General   Post-op Pain Management:     Induction:   Airway Management Planned:   Additional Equipment:   Intra-op Plan:   Post-operative Plan:   Informed Consent: I have reviewed the patients History and Physical, chart, labs and discussed the procedure including the risks, benefits and alternatives for the proposed anesthesia with the patient or authorized representative who has indicated his/her understanding and acceptance.   Dental Advisory Given  Plan Discussed with: CRNA  Anesthesia Plan Comments:         Anesthesia Quick Evaluation

## 2016-12-13 NOTE — Transfer of Care (Signed)
Immediate Anesthesia Transfer of Care Note  Patient: Steffanie RainwaterNakethia N Certain  Procedure(s) Performed: Procedure(s): COLONOSCOPY WITH PROPOFOL (N/A)  Patient Location: PACU  Anesthesia Type:General  Level of Consciousness: awake, alert  and oriented  Airway & Oxygen Therapy: Patient Spontanous Breathing and Patient connected to nasal cannula oxygen  Post-op Assessment: Report given to RN and Post -op Vital signs reviewed and stable  Post vital signs: Reviewed and stable  Last Vitals:  Vitals:   12/13/16 0938  BP: 109/69  Pulse: 82  Resp: 18  Temp: 36.6 C    Last Pain:  Vitals:   12/13/16 0938  TempSrc: Tympanic         Complications: No apparent anesthesia complications

## 2016-12-13 NOTE — Op Note (Addendum)
[facility] Gastroenterology Patient Name: Kathy Jackson Procedure Date: 12/13/2016 9:43 AM MRN: [id number] Account #: [id number] Date of Birth: [birth year] Admit Type: Outpatient Age: 46 Room: [facility] ENDO ROOM 3 Gender: Female Note Status: Finalized Procedure:            Colonoscopy Indications:          Family history of colonic polyps in a first-degree                        relative Providers:            Christena Deem, MD Medicines:            Monitored Anesthesia Care Complications:        No immediate complications. Procedure:            Pre-Anesthesia Assessment:                       - ASA Grade Assessment: II - A patient with mild                        systemic disease.                       After obtaining informed consent, the colonoscope was                        passed under direct vision. Throughout the procedure,                        the patient's blood pressure, pulse, and oxygen                        saturations were monitored continuously. The                        Colonoscope was introduced through the anus and                        advanced to the the cecum, identified by appendiceal                        orifice and ileocecal valve. The colonoscopy was                        performed without difficulty. The patient tolerated the                        procedure well. The quality of the bowel preparation                        was good. Findings:      A 1 mm polyp was found in the proximal transverse colon. The polyp was       sessile. The polyp was removed with a cold biopsy forceps. Resection and       retrieval were complete.      The retroflexed view of the distal rectum and anal verge was normal and       showed no anal or rectal abnormalities.      A few small-mouthed diverticula were found in the sigmoid colon and       descending  colon.      The digital rectal exam was normal.      Of note there is an extrinsic  compression of the base of the cecum. This       could possibly be an ovary or other structure. Impression:           - One 1 mm polyp in the proximal transverse colon,                        removed with a cold biopsy forceps. Resected and                        retrieved.                       - The distal rectum and anal verge are normal on                        retroflexion view.                       - Diverticulosis in the sigmoid colon and in the                        descending colon. Recommendation:       - Discharge patient to home.                       - Repeat colonoscopy in 5 years for screening purposes.                       - Perform a CT scan (computed tomography) of abdomen                        with contrast and pelvis with contrast at appointment                        to be scheduled for abnormal finding on colonoscopy,                        possible extrinsic compression of the cecum. Procedure Code(s):    --- Professional ---                       [id number], Colonoscopy, flexible; with biopsy, single or                        multiple Diagnosis Code(s):    --- Professional ---                       D12.3, Benign neoplasm of transverse colon (hepatic                        flexure or splenic flexure)                       Z83.71, Family history of colonic polyps                       K57.30, Diverticulosis of large intestine without  perforation or abscess without bleeding CPT copyright 2016 American Medical Association. All rights reserved. The codes documented in this report are preliminary and upon coder review may  be revised to meet current compliance requirements. Christena DeemMartin U Deepak Bless, MD 12/13/2016 10:50:31 AM This report has been signed electronically. Number of Addenda: 0 Note Initiated On: 12/13/2016 9:43 AM Scope Withdrawal Time: 0 hours 5 minutes 36 seconds  Total Procedure Duration: 0 hours 30 minutes 1 second       [facility]  [facility]

## 2016-12-13 NOTE — H&P (Signed)
Outpatient short stay form Pre-procedure 12/13/2016 9:58 AM Christena DeemMartin U Nanci Lakatos MD  Primary Physician: Dr. Aram BeechamJeffrey Sparks  Reason for visit:  Colonoscopy  History of present illness:  Patient is a 46 year old female presenting today as above. There is family history of colon polyps in her brother. She tolerated her prep well. She takes 81 mg aspirin but hasn't for at least a month. She takes no other aspirin products or blood thinning agents. This is her first colonoscopy.    Current Facility-Administered Medications:  .  0.9 %  sodium chloride infusion, , Intravenous, Continuous, Christena DeemMartin U Emylia Latella, MD .  0.9 %  sodium chloride infusion, , Intravenous, Continuous, Christena DeemMartin U Lander Eslick, MD  Prescriptions Prior to Admission  Medication Sig Dispense Refill Last Dose  . Cholecalciferol (VITAMIN D-3) 5000 UNITS TABS Take by mouth daily.   Past Week at Unknown time  . fluticasone (FLONASE) 50 MCG/ACT nasal spray Place 2 sprays into both nostrils daily.   Past Week at Unknown time  . pantoprazole (PROTONIX) 40 MG tablet Take 40 mg by mouth daily.   Past Week at Unknown time  . phentermine 37.5 MG capsule Take 37.5 mg by mouth every morning.   Past Week at Unknown time  . albuterol (PROVENTIL HFA;VENTOLIN HFA) 108 (90 Base) MCG/ACT inhaler Inhale 2 puffs into the lungs every 6 (six) hours as needed for wheezing or shortness of breath. 1 Inhaler 0 Taking     No Known Allergies   Past Medical History:  Diagnosis Date  . Constipation   . Endometriosis   . Goiter   . Headache    Migraines  . History of kidney stones   . Nephrolithiasis     Review of systems:      Physical Exam    Heart and lungs: Regular rate and rhythm without rub or gallop, lungs are bilaterally clear.    HEENT: Normocephalic atraumatic eyes are anicteric    Other:     Pertinant exam for procedure: Soft nontender nondistended bowel sounds positive normoactive.    Planned proceedures: Colonoscopy and indicated  procedures. I have discussed the risks benefits and complications of procedures to include not limited to bleeding, infection, perforation and the risk of sedation and the patient wishes to proceed.    Christena DeemMartin U Arnitra Sokoloski, MD Gastroenterology 12/13/2016  9:58 AM

## 2016-12-13 NOTE — Anesthesia Post-op Follow-up Note (Cosign Needed)
Anesthesia QCDR form completed.        

## 2016-12-16 ENCOUNTER — Other Ambulatory Visit: Payer: Self-pay | Admitting: Gastroenterology

## 2016-12-16 ENCOUNTER — Encounter: Payer: Self-pay | Admitting: Gastroenterology

## 2016-12-16 DIAGNOSIS — R102 Pelvic and perineal pain: Secondary | ICD-10-CM

## 2016-12-16 DIAGNOSIS — R1084 Generalized abdominal pain: Secondary | ICD-10-CM

## 2016-12-16 DIAGNOSIS — R933 Abnormal findings on diagnostic imaging of other parts of digestive tract: Secondary | ICD-10-CM

## 2016-12-16 NOTE — Anesthesia Postprocedure Evaluation (Signed)
Anesthesia Post Note  Patient: Kathy Jackson  Procedure(s) Performed: Procedure(s) (LRB): COLONOSCOPY WITH PROPOFOL (N/A)  Patient location during evaluation: PACU Anesthesia Type: General Level of consciousness: awake and alert Pain management: pain level controlled Vital Signs Assessment: post-procedure vital signs reviewed and stable Respiratory status: spontaneous breathing, nonlabored ventilation, respiratory function stable and patient connected to nasal cannula oxygen Cardiovascular status: blood pressure returned to baseline and stable Postop Assessment: no signs of nausea or vomiting Anesthetic complications: no     Last Vitals:  Vitals:   12/13/16 1120 12/13/16 1130  BP: (!) 112/56 114/66  Pulse: 75 78  Resp: (!) 23 20  Temp:      Last Pain:  Vitals:   12/14/16 1612  TempSrc:   PainSc: 0-No pain                 Yevette EdwardsJames G Leyah Bocchino

## 2016-12-17 LAB — SURGICAL PATHOLOGY

## 2016-12-23 ENCOUNTER — Ambulatory Visit
Admission: RE | Admit: 2016-12-23 | Discharge: 2016-12-23 | Disposition: A | Discharge status: Home / Self Care | Payer: 59 | Source: Ambulatory Visit | Attending: Gastroenterology | Admitting: Gastroenterology

## 2016-12-23 DIAGNOSIS — R1084 Generalized abdominal pain: Secondary | ICD-10-CM | POA: Diagnosis present

## 2016-12-23 DIAGNOSIS — R933 Abnormal findings on diagnostic imaging of other parts of digestive tract: Secondary | ICD-10-CM

## 2016-12-23 DIAGNOSIS — Z90712 Acquired absence of cervix with remaining uterus: Secondary | ICD-10-CM | POA: Diagnosis not present

## 2016-12-23 DIAGNOSIS — R102 Pelvic and perineal pain: Secondary | ICD-10-CM | POA: Insufficient documentation

## 2016-12-23 DIAGNOSIS — N83201 Unspecified ovarian cyst, right side: Secondary | ICD-10-CM | POA: Insufficient documentation

## 2016-12-23 MED ORDER — IOPAMIDOL (ISOVUE-300) INJECTION 61%
100.0000 mL | Freq: Once | INTRAVENOUS | Status: AC | PRN
  Administered 2016-12-23: 100 mL via INTRAVENOUS

## 2016-12-24 ENCOUNTER — Ambulatory Visit: Payer: 59

## 2017-01-29 ENCOUNTER — Other Ambulatory Visit: Payer: Self-pay | Admitting: Internal Medicine

## 2017-01-29 DIAGNOSIS — R0602 Shortness of breath: Secondary | ICD-10-CM

## 2017-02-21 ENCOUNTER — Ambulatory Visit
Admission: RE | Admit: 2017-02-21 | Discharge: 2017-02-21 | Disposition: A | Discharge status: Home / Self Care | Payer: 59 | Source: Ambulatory Visit | Attending: Internal Medicine | Admitting: Internal Medicine

## 2017-02-21 DIAGNOSIS — R0602 Shortness of breath: Secondary | ICD-10-CM | POA: Insufficient documentation

## 2017-02-21 DIAGNOSIS — E041 Nontoxic single thyroid nodule: Secondary | ICD-10-CM | POA: Insufficient documentation

## 2017-02-21 DIAGNOSIS — R911 Solitary pulmonary nodule: Secondary | ICD-10-CM | POA: Insufficient documentation

## 2017-02-21 MED ORDER — IOPAMIDOL (ISOVUE-370) INJECTION 76%
75.0000 mL | Freq: Once | INTRAVENOUS | Status: AC | PRN
  Administered 2017-02-21: 75 mL via INTRAVENOUS

## 2017-03-10 ENCOUNTER — Ambulatory Visit (INDEPENDENT_AMBULATORY_CARE_PROVIDER_SITE_OTHER): Payer: 59 | Admitting: Obstetrics and Gynecology

## 2017-03-10 ENCOUNTER — Encounter: Payer: Self-pay | Admitting: Obstetrics and Gynecology

## 2017-03-10 VITALS — BP 102/62 | HR 62 | Ht 64.0 in | Wt 172.0 lb

## 2017-03-10 DIAGNOSIS — Z01419 Encounter for gynecological examination (general) (routine) without abnormal findings: Secondary | ICD-10-CM | POA: Diagnosis not present

## 2017-03-10 DIAGNOSIS — Z1231 Encounter for screening mammogram for malignant neoplasm of breast: Secondary | ICD-10-CM | POA: Diagnosis not present

## 2017-03-10 DIAGNOSIS — N83201 Unspecified ovarian cyst, right side: Secondary | ICD-10-CM

## 2017-03-10 DIAGNOSIS — Z1239 Encounter for other screening for malignant neoplasm of breast: Secondary | ICD-10-CM

## 2017-03-10 DIAGNOSIS — Z803 Family history of malignant neoplasm of breast: Secondary | ICD-10-CM | POA: Diagnosis not present

## 2017-03-10 NOTE — Progress Notes (Signed)
Chief Complaint  Patient presents with  . Gynecologic Exam     HPI:      Ms. Kathy Jackson is a 46 y.o. No obstetric history on file. who LMP was No LMP recorded. Patient has had a hysterectomy., presents today for her annual examination.  Her menses are absent due to TAH for endometriosis. Dysmenorrhea none. She does not have intermenstrual bleeding. She does have vasomotor sx that are mostly tolerable. Pt cont to have RLQ pain mid cycle. Sx have been going on for years, but pt was found to have a RTO cyst on CT scan 4/18. (CT scan report reviewed and showed 2.4 cm RTO cyst and 1.8 cm hemorrhagic RTO cyst.)  Sex activity: single partner, contraception - status post hysterectomy.  Last Pap: December 08, 2014  Results were: no abnormalities /neg HPV DNA  Hx of STDs: none  Last mammogram: March 15, 2016  Results were: normal--routine follow-up in 12 months There is a FH of breast cancer in her mom, genetic testing not indicated. There is no FH of ovarian cancer. The patient does do self-breast exams.  Tobacco use: The patient denies current or previous tobacco use. Alcohol use: none Exercise: moderately active  She does get adequate calcium and Vitamin D in her diet.    Past Medical History:  Diagnosis Date  . Constipation   . Endometriosis   . Goiter   . Headache    Migraines  . History of kidney stones   . Nephrolithiasis     Past Surgical History:  Procedure Laterality Date  . ABDOMINAL HYSTERECTOMY    . COLONOSCOPY WITH PROPOFOL N/A 12/13/2016   Procedure: COLONOSCOPY WITH PROPOFOL;  Surgeon: Christena Deem, MD;  Location[facility] ENDOSCOPY;  Service: Endoscopy;  Laterality: N/A;  . ESOPHAGOGASTRODUODENOSCOPY (EGD) WITH PROPOFOL N/A 06/14/2016   Procedure: ESOPHAGOGASTRODUODENOSCOPY (EGD) WITH PROPOFOL;  Surgeon: Christena Deem, MD;  Location: [facility] ENDOSCOPY;  Service: Endoscopy;  Laterality: N/A;  . FOOT SURGERY      Family History  Problem Relation Age of Onset   . Breast cancer Mother   . Prostate cancer Brother     Social History   Social History  . Marital status: Married    Spouse name: N/A  . Number of children: 2  . Years of education: N/A   Occupational History  . revenue and billing    Social History Main Topics  . Smoking status: Never Smoker  . Smokeless tobacco: Never Used  . Alcohol use No  . Drug use: No  . Sexual activity: Yes    Birth control/ protection: Surgical   Other Topics Concern  . Not on file   Social History Narrative  . No narrative on file     Current Outpatient Prescriptions:  .  budesonide-formoterol (SYMBICORT) 160-4.5 MCG/ACT inhaler, Inhale into the lungs., Disp: , Rfl:  .  albuterol (PROVENTIL HFA;VENTOLIN HFA) 108 (90 Base) MCG/ACT inhaler, Inhale 2 puffs into the lungs every 6 (six) hours as needed for wheezing or shortness of breath., Disp: 1 Inhaler, Rfl: 0 .  Cholecalciferol (VITAMIN D-3) 5000 UNITS TABS, Take by mouth daily., Disp: , Rfl:  .  fluticasone (FLONASE) 50 MCG/ACT nasal spray, Place 2 sprays into both nostrils daily., Disp: , Rfl:  .  pantoprazole (PROTONIX) 40 MG tablet, Take 40 mg by mouth daily., Disp: , Rfl:  .  phentermine 37.5 MG capsule, Take 37.5 mg by mouth every morning., Disp: , Rfl:   ROS:  Review of  Systems  Constitutional: Positive for fatigue. Negative for fever and unexpected weight change.  Respiratory: Positive for shortness of breath. Negative for cough and wheezing.   Cardiovascular: Negative for chest pain, palpitations and leg swelling.  Gastrointestinal: Negative for blood in stool, constipation, diarrhea, nausea and vomiting.  Endocrine: Negative for cold intolerance, heat intolerance and polyuria.  Genitourinary: Negative for dyspareunia, dysuria, flank pain, frequency, genital sores, hematuria, menstrual problem, pelvic pain, urgency, vaginal bleeding, vaginal discharge and vaginal pain.  Musculoskeletal: Positive for arthralgias. Negative for back  pain, joint swelling and myalgias.  Skin: Negative for rash.  Neurological: Positive for headaches. Negative for dizziness, syncope, light-headedness and numbness.  Hematological: Negative for adenopathy.  Psychiatric/Behavioral: Negative for agitation, confusion, sleep disturbance and suicidal ideas. The patient is nervous/anxious.      Objective: BP 102/62   Pulse 62   Ht 5\' 4"  (1.626 m)   Wt 172 lb (78 kg)   BMI 29.52 kg/m    Physical Exam  Constitutional: She is oriented to person, place, and time. She appears well-developed and well-nourished.  Genitourinary: Vagina normal. There is no rash or tenderness on the right labia. There is no rash or tenderness on the left labia. No erythema or tenderness in the vagina. No vaginal discharge found. Right adnexum does not display mass and does not display tenderness. Left adnexum does not display mass and does not display tenderness.  Genitourinary Comments: UTERUS/CX SURG REM  Neck: Normal range of motion. No thyromegaly present.  Cardiovascular: Normal rate, regular rhythm and normal heart sounds.   No murmur heard. Pulmonary/Chest: Effort normal and breath sounds normal. Right breast exhibits no mass, no nipple discharge, no skin change and no tenderness. Left breast exhibits no mass, no nipple discharge, no skin change and no tenderness.  Abdominal: Soft. There is no tenderness. There is no guarding.  Musculoskeletal: Normal range of motion.  Neurological: She is alert and oriented to person, place, and time. No cranial nerve deficit.  Psychiatric: She has a normal mood and affect. Her behavior is normal.  Vitals reviewed.    Assessment/Plan: Encounter for annual routine gynecological examination  Screening for breast cancer - Pt to sched mammo. - Plan: MM SCREENING BREAST TOMO BILATERAL  Family history of breast cancer - Plan: MM SCREENING BREAST TOMO BILATERAL  Cyst of right ovary - On CT scan. Check GYN u/s for f/u. Will  call pt with results. - Plan: US Transvaginal Non-OB             GYN counsel mammography screening, menopause, adequate intake of calcium and vitamin D, diet and exercise     F/U  Return in about 2 days (around 03/12/2017) for GYN u/s for RTO cyst--[facility] to call pt.  Briceson Broadwater B. Yuleimy Kretz, PA-C 03/10/2017 4:00 PM

## 2017-03-12 ENCOUNTER — Other Ambulatory Visit: Payer: 59

## 2017-03-14 ENCOUNTER — Ambulatory Visit (INDEPENDENT_AMBULATORY_CARE_PROVIDER_SITE_OTHER): Payer: 59

## 2017-03-14 DIAGNOSIS — N83201 Unspecified ovarian cyst, right side: Secondary | ICD-10-CM

## 2017-03-14 NOTE — Progress Notes (Signed)
Review of ULTRASOUND.    I have personally reviewed images and [facility]    Plan of management to be discussed with patient.  Annamarie MajorPaul Tequia Wolman, MD, Merlinda FrederickFACOG [facility], [facility] 03/14/2017  11:41 AM

## 2017-03-17 ENCOUNTER — Telehealth: Payer: Self-pay | Admitting: Obstetrics and Gynecology

## 2017-03-17 NOTE — Telephone Encounter (Signed)
Pt aware of resolved ovar cysts on u/s (2 RTO cysts on 4/18 CT scan), and now with bilat follicles. Mid cycle pain could be mittleschmertz. NSAIDs. Also has hx of endometriosis. F/u prn.

## 2017-04-21 ENCOUNTER — Encounter: Payer: Self-pay | Admitting: Obstetrics and Gynecology

## 2017-04-21 ENCOUNTER — Ambulatory Visit
Admission: RE | Admit: 2017-04-21 | Discharge: 2017-04-21 | Disposition: A | Discharge status: Home / Self Care | Payer: 59 | Source: Ambulatory Visit | Attending: Obstetrics and Gynecology | Admitting: Obstetrics and Gynecology

## 2017-04-21 DIAGNOSIS — Z803 Family history of malignant neoplasm of breast: Secondary | ICD-10-CM

## 2017-04-21 DIAGNOSIS — Z1231 Encounter for screening mammogram for malignant neoplasm of breast: Secondary | ICD-10-CM | POA: Insufficient documentation

## 2017-04-21 DIAGNOSIS — Z1239 Encounter for other screening for malignant neoplasm of breast: Secondary | ICD-10-CM

## 2018-01-10 IMAGING — CT CT ABD-PELV W/ CM
1 of 3 series · 14 of 32 positions shown, 19 images · IV contrast (APPLIED)
Comparison: None.

CLINICAL DATA: Abnormal colonoscopy. Right-sided pelvic pain.
Abdominal pain.

EXAM:
CT ABDOMEN AND PELVIS WITH CONTRAST
TECHNIQUE: Multidetector CT imaging of the abdomen and pelvis was performed
using the standard protocol following bolus administration of
intravenous contrast.
CONTRAST:  100mL LNE4AQ-T44 IOPAMIDOL (LNE4AQ-T44) INJECTION 61%

[Series 2: axial st · axial · 0.64mm/px · z∈[-881,-471]mm · 14 of 92 slices shown, 19 images]
[im 5/92  soft-tissue]
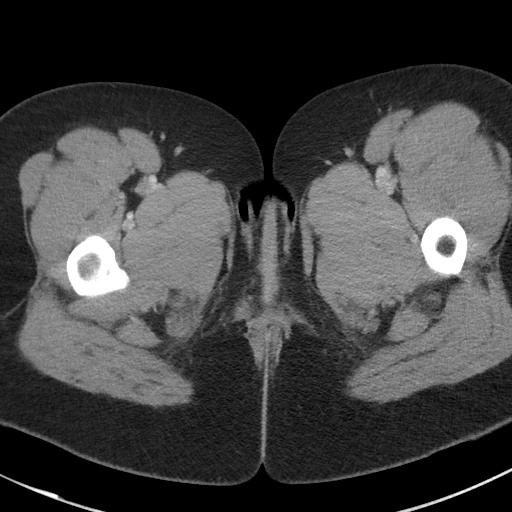
[im 5/92  bone]
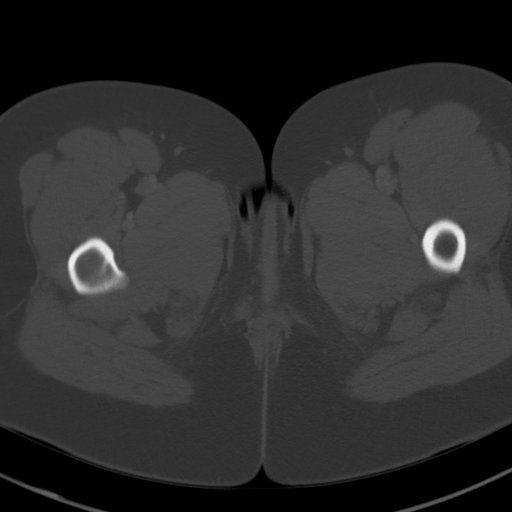
[im 14/92  soft-tissue]
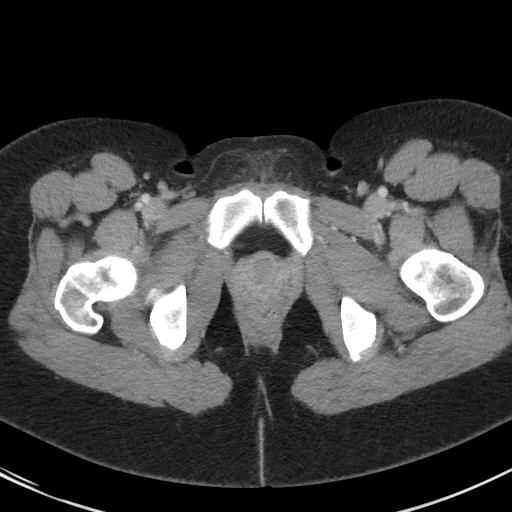
[im 19/92  soft-tissue]
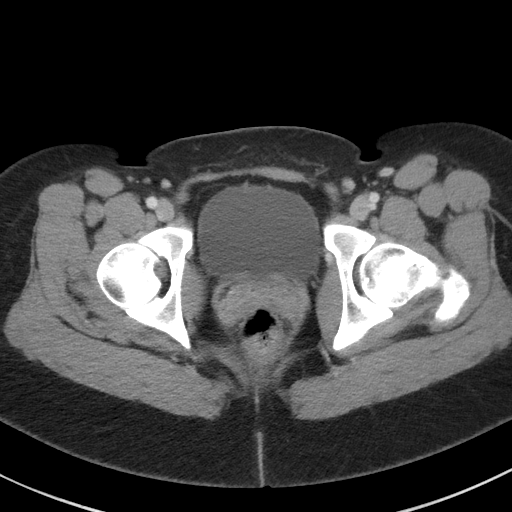
[im 28/92  soft-tissue]
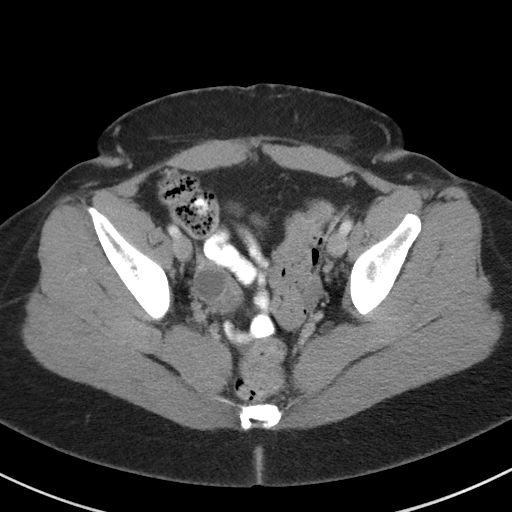
[im 32/92  soft-tissue]
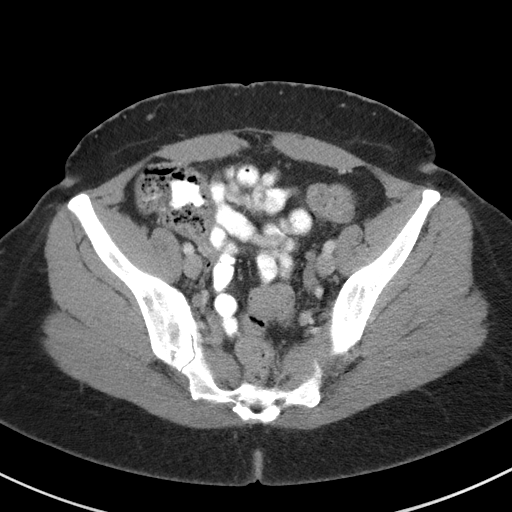
[im 41/92  soft-tissue]
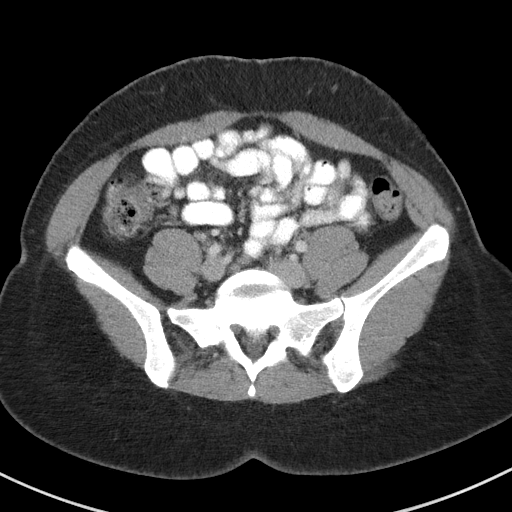
[im 46/92  soft-tissue]
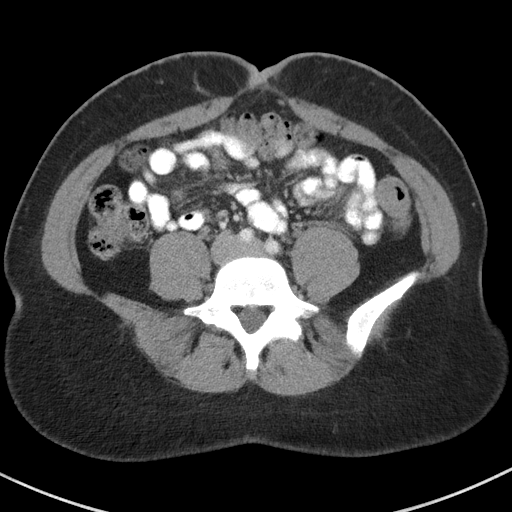
[im 51/92  soft-tissue]
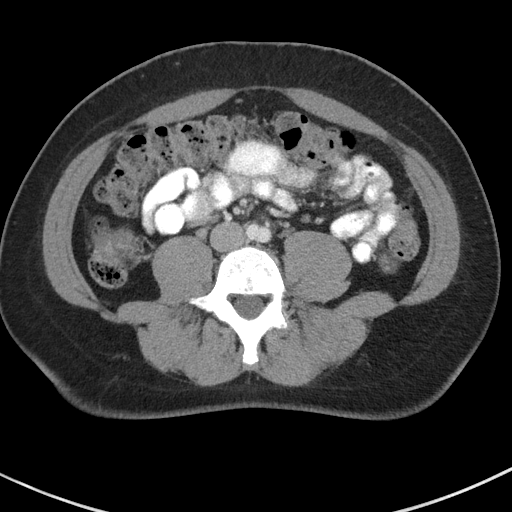
[im 60/92  soft-tissue]
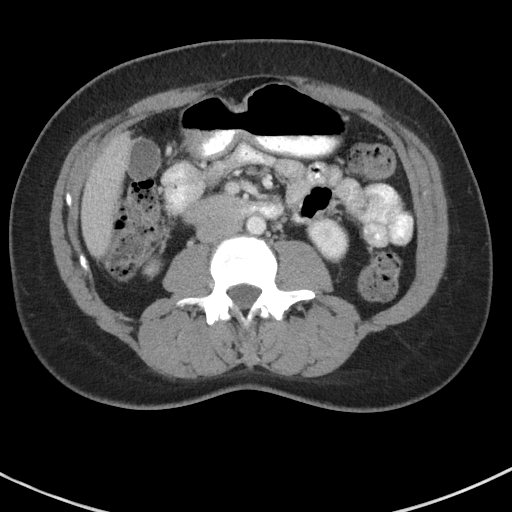
[im 60/92  bone]
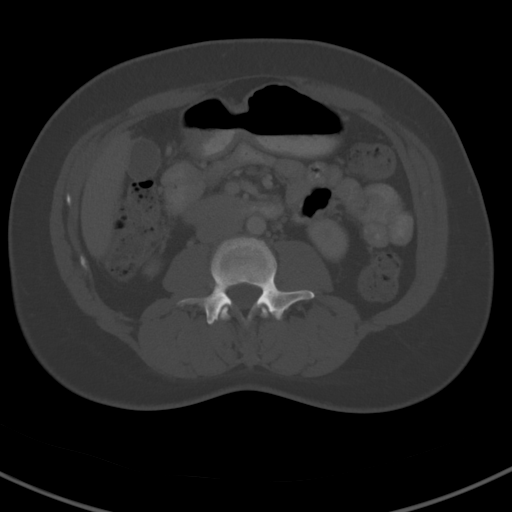
[im 64/92  soft-tissue]
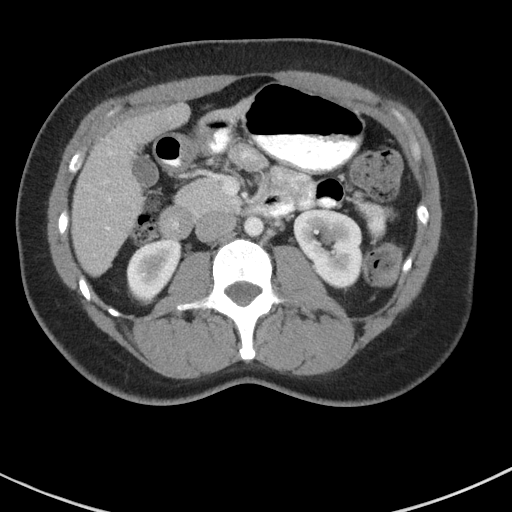
[im 73/92  soft-tissue]
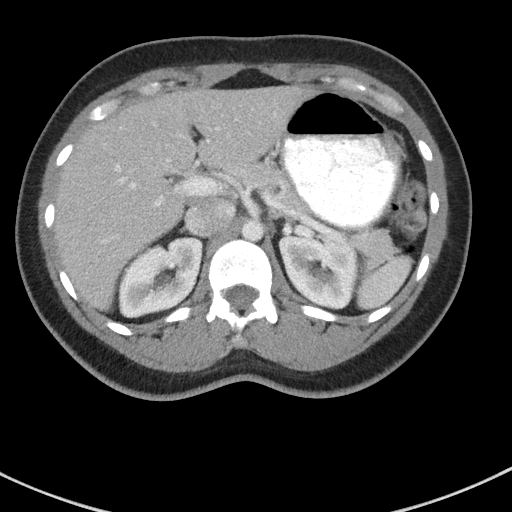
[im 73/92  lung]
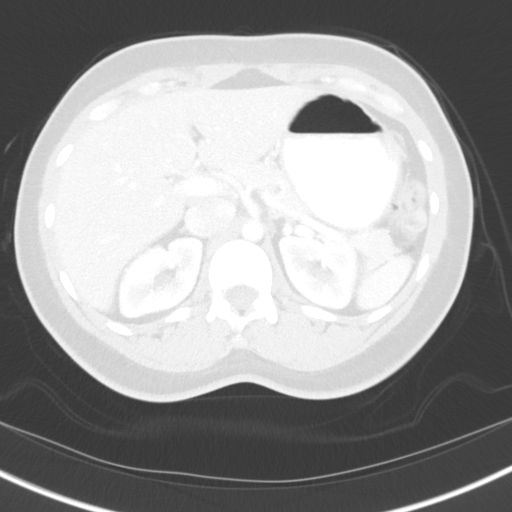
[im 78/92  soft-tissue]
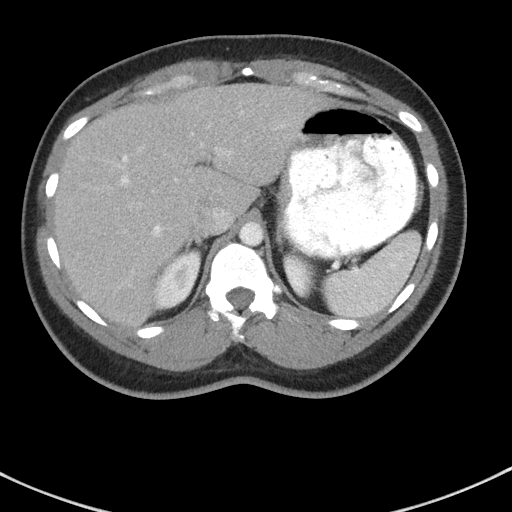
[im 78/92  lung]
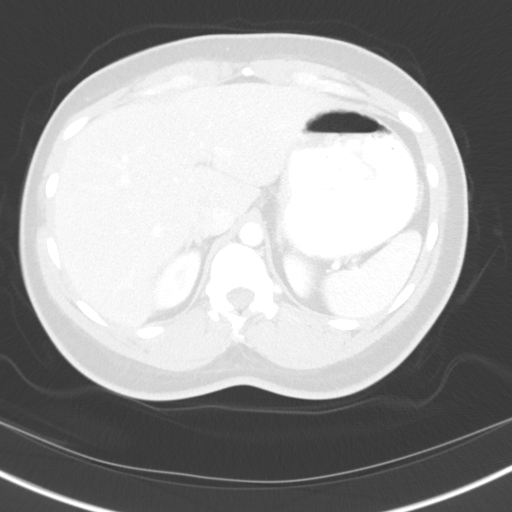
[im 82/92  lung]
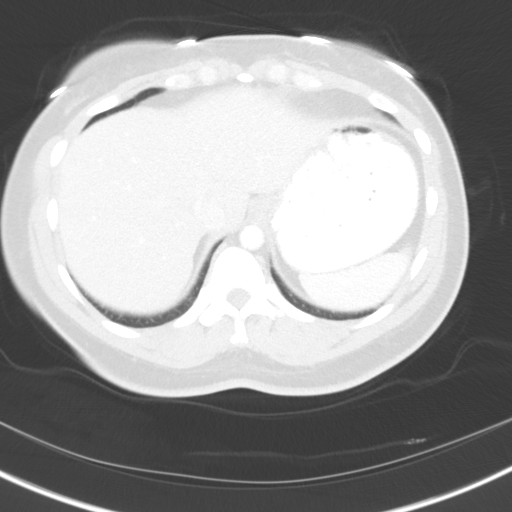
[im 87/92  soft-tissue]
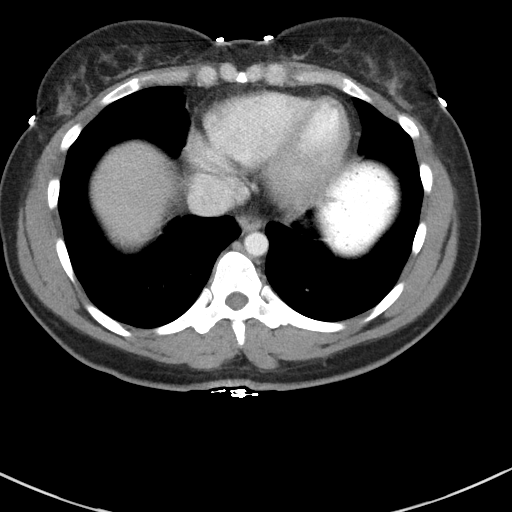
[im 87/92  lung]
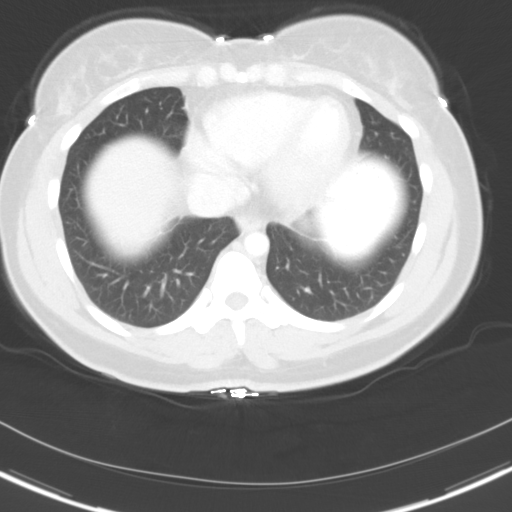

[14 of 32 positions shown; findings below may reference images not displayed]

FINDINGS: Lower chest: Normal.

Hepatobiliary: No focal liver abnormality is seen. No gallstones,
gallbladder wall thickening, or biliary dilatation.

Pancreas: Unremarkable. No pancreatic ductal dilatation or
surrounding inflammatory changes.

Spleen: Normal in size without focal abnormality.

Adrenals/Urinary Tract: Adrenal glands are normal. 4 mm stone in the
upper pole of the left kidney. Kidneys are otherwise normal. Ureters
and bladder appear normal.

Stomach/Bowel: Stomach is within normal limits. Appendix appears
normal. No evidence of bowel wall thickening, distention, or
inflammatory changes.

Vascular/Lymphatic: No significant vascular findings are present. No
enlarged abdominal or pelvic lymph nodes.

Reproductive: The uterus has been removed. Left ovary is normal. 24
mm cyst on the right ovary. 18 mm hemorrhagic cyst on the right
ovary.

Other: No abdominal wall hernia or abnormality. No abdominopelvic
ascites.

Musculoskeletal: No acute or significant osseous findings.
IMPRESSION: Two cysts on the right ovary, 1 containing a small amount of
hemorrhage.

Otherwise, benign appearing abdomen.

## 2018-06-23 ENCOUNTER — Other Ambulatory Visit: Payer: Self-pay | Admitting: Internal Medicine

## 2018-06-23 DIAGNOSIS — Z1231 Encounter for screening mammogram for malignant neoplasm of breast: Secondary | ICD-10-CM

## 2018-06-25 ENCOUNTER — Ambulatory Visit
Admission: RE | Admit: 2018-06-25 | Discharge: 2018-06-25 | Disposition: A | Discharge status: Home / Self Care | Payer: Managed Care, Other (non HMO) | Source: Ambulatory Visit | Attending: Internal Medicine | Admitting: Internal Medicine

## 2018-06-25 DIAGNOSIS — Z1231 Encounter for screening mammogram for malignant neoplasm of breast: Secondary | ICD-10-CM | POA: Diagnosis present

## 2018-06-30 ENCOUNTER — Encounter: Payer: Self-pay | Admitting: Obstetrics and Gynecology

## 2018-06-30 ENCOUNTER — Ambulatory Visit (INDEPENDENT_AMBULATORY_CARE_PROVIDER_SITE_OTHER): Payer: Managed Care, Other (non HMO) | Admitting: Obstetrics and Gynecology

## 2018-06-30 VITALS — BP 110/62 | HR 75 | Ht 64.0 in | Wt 190.0 lb

## 2018-06-30 DIAGNOSIS — R1031 Right lower quadrant pain: Secondary | ICD-10-CM | POA: Diagnosis not present

## 2018-06-30 DIAGNOSIS — Z1239 Encounter for other screening for malignant neoplasm of breast: Secondary | ICD-10-CM

## 2018-06-30 DIAGNOSIS — Z01411 Encounter for gynecological examination (general) (routine) with abnormal findings: Secondary | ICD-10-CM

## 2018-06-30 DIAGNOSIS — Z1151 Encounter for screening for human papillomavirus (HPV): Secondary | ICD-10-CM | POA: Diagnosis not present

## 2018-06-30 DIAGNOSIS — G8929 Other chronic pain: Secondary | ICD-10-CM | POA: Insufficient documentation

## 2018-06-30 DIAGNOSIS — Z01419 Encounter for gynecological examination (general) (routine) without abnormal findings: Secondary | ICD-10-CM

## 2018-06-30 DIAGNOSIS — Z124 Encounter for screening for malignant neoplasm of cervix: Secondary | ICD-10-CM

## 2018-06-30 NOTE — Patient Instructions (Signed)
I value your feedback and entrusting us with your care. If you get a  patient survey, I would appreciate you taking the time to let us know about your experience today. Thank you! 

## 2018-06-30 NOTE — Progress Notes (Signed)
Chief Complaint  Patient presents with  . Gynecologic Exam     HPI:      Ms. Kathy Jackson is a 47 y.o. No obstetric history on file. who LMP was No LMP recorded. Patient has had a hysterectomy., presents today for her annual examination.  Her menses are absent due to TAH for endometriosis. Dysmenorrhea none. She does not have intermenstrual bleeding. She does have occas vasomotor sx. Pt cont to have RLQ pain mid cycle. Sx have been going on for years. Hx of RTO cyst on CT scan 4/18. (CT scan report reviewed and showed 2.4 cm RTO cyst and 1.8 cm hemorrhagic RTO cyst.) but had neg f/u GYN u/s last yr. Takes NSAIDs prn sx with relief.  Sex activity: single partner, contraception - status post hysterectomy. Has some vag dryness and occas RLQ dyspareunia. Last Pap: December 08, 2014  Results were: no abnormalities /neg HPV DNA  Hx of STDs: none  Last mammogram: 06/25/18  Results were: normal--routine follow-up in 12 months There is a FH of breast cancer in her mom, genetic testing not indicated. There is no FH of ovarian cancer. Her brother had prostate cancer at a young age but it wasn't mets/chemo not needed. Genetic testing not indicated. Will cont to follow FH. The patient does do self-breast exams.  Tobacco use: The patient denies current or previous tobacco use. Alcohol use: none Exercise: moderately active  She does get adequate calcium but not Vitamin D in her diet. Labs through work.  S/P colonoscopy 3/18 with Dr. Marva Panda. Repeat in 5 yrs due to polyp.    Past Medical History:  Diagnosis Date  . Constipation   . Endometriosis   . Goiter   . Headache    Migraines  . History of kidney stones   . Nephrolithiasis     Past Surgical History:  Procedure Laterality Date  . ABDOMINAL HYSTERECTOMY    . COLONOSCOPY WITH PROPOFOL N/A 12/13/2016   Procedure: COLONOSCOPY WITH PROPOFOL;  Surgeon: Christena Deem, MD;  Location: [facility] ENDOSCOPY;  Service: Endoscopy;  Laterality:  N/A;  . ESOPHAGOGASTRODUODENOSCOPY (EGD) WITH PROPOFOL N/A 06/14/2016   Procedure: ESOPHAGOGASTRODUODENOSCOPY (EGD) WITH PROPOFOL;  Surgeon: Christena Deem, MD;  Location: [facility] ENDOSCOPY;  Service: Endoscopy;  Laterality: N/A;  . FOOT SURGERY      Family History  Problem Relation Age of Onset  . Breast cancer Mother 27  . Prostate cancer Brother 79  . Kidney failure Maternal Grandfather     Social History   Socioeconomic History  . Marital status: Married    Spouse name: Not on file  . Number of children: 2  . Years of education: Not on file  . Highest education level: Not on file  Occupational History  . Occupation: revenue and billing  Social Needs  . Financial resource strain: Not on file  . Food insecurity:    Worry: Not on file    Inability: Not on file  . Transportation needs:    Medical: Not on file    Non-medical: Not on file  Tobacco Use  . Smoking status: Never Smoker  . Smokeless tobacco: Never Used  Substance and Sexual Activity  . Alcohol use: No  . Drug use: No  . Sexual activity: Yes    Birth control/protection: Surgical  Lifestyle  . Physical activity:    Days per week: Not on file    Minutes per session: Not on file  . Stress: Not on file  Relationships  .  Social connections:    Talks on phone: Not on file    Gets together: Not on file    Attends religious service: Not on file    Active member of club or organization: Not on file    Attends meetings of clubs or organizations: Not on file    Relationship status: Not on file  . Intimate partner violence:    Fear of current or ex partner: Not on file    Emotionally abused: Not on file    Physically abused: Not on file    Forced sexual activity: Not on file  Other Topics Concern  . Not on file  Social History Narrative  . Not on file     Current Outpatient Medications:  .  albuterol (PROVENTIL HFA;VENTOLIN HFA) 108 (90 Base) MCG/ACT inhaler, Inhale 2 puffs into the lungs every 6 (six)  hours as needed for wheezing or shortness of breath., Disp: 1 Inhaler, Rfl: 0 .  fluticasone (FLONASE) 50 MCG/ACT nasal spray, Place 2 sprays into both nostrils daily., Disp: , Rfl:  .  meloxicam (MOBIC) 15 MG tablet, Take by mouth., Disp: , Rfl:  .  pantoprazole (PROTONIX) 40 MG tablet, Take 40 mg by mouth daily., Disp: , Rfl:  .  tiotropium (SPIRIVA) 18 MCG inhalation capsule, Place into inhaler and inhale., Disp: , Rfl:  .  budesonide-formoterol (SYMBICORT) 160-4.5 MCG/ACT inhaler, Inhale into the lungs., Disp: , Rfl:   ROS:  Review of Systems  Constitutional: Negative for fatigue, fever and unexpected weight change.  Respiratory: Positive for shortness of breath. Negative for cough and wheezing.   Cardiovascular: Negative for chest pain, palpitations and leg swelling.  Gastrointestinal: Negative for blood in stool, constipation, diarrhea, nausea and vomiting.  Endocrine: Negative for cold intolerance, heat intolerance and polyuria.  Genitourinary: Negative for dyspareunia, dysuria, flank pain, frequency, genital sores, hematuria, menstrual problem, pelvic pain, urgency, vaginal bleeding, vaginal discharge and vaginal pain.  Musculoskeletal: Positive for arthralgias. Negative for back pain, joint swelling and myalgias.  Skin: Negative for rash.  Neurological: Positive for headaches. Negative for dizziness, syncope, light-headedness and numbness.  Hematological: Negative for adenopathy.  Psychiatric/Behavioral: Negative for agitation, confusion, sleep disturbance and suicidal ideas. The patient is not nervous/anxious.      Objective: BP 110/62   Pulse 75   Ht 5\' 4"  (1.626 m)   Wt 190 lb (86.2 kg)   BMI 32.61 kg/m    Physical Exam  Constitutional: She is oriented to person, place, and time. She appears well-developed and well-nourished.  Genitourinary: Vagina normal. There is no rash or tenderness on the right labia. There is no rash or tenderness on the left labia. No erythema or  tenderness in the vagina. No vaginal discharge found. Right adnexum does not display mass and does not display tenderness. Left adnexum does not display mass and does not display tenderness.  Genitourinary Comments: UTERUS/CX SURG REM  Neck: Normal range of motion. No thyromegaly present.  Cardiovascular: Normal rate, regular rhythm and normal heart sounds.  No murmur heard. Pulmonary/Chest: Effort normal and breath sounds normal. Right breast exhibits no mass, no nipple discharge, no skin change and no tenderness. Left breast exhibits no mass, no nipple discharge, no skin change and no tenderness.  Abdominal: Soft. There is no tenderness. There is no guarding.  Musculoskeletal: Normal range of motion.  Neurological: She is alert and oriented to person, place, and time. No cranial nerve deficit.  Psychiatric: She has a normal mood and affect. Her behavior is normal.  Vitals reviewed.  Assessment/Plan: Encounter for annual routine gynecological examination  Cervical cancer screening - Plan: IGP, Aptima HPV  Screening for HPV (human papillomavirus) - Plan: IGP, Aptima HPV  Screening for breast cancer - Pt current on mammo  Chronic RLQ pain - Sx for several yrs. Neg GYN u/s last yr. Hx of endometriosis. Discussed dx lap vs cont NSAIDs prn. Sx should impove post menopause, too. Pt to f/u prn.             GYN counsel mammography screening, menopause, adequate intake of calcium and vitamin D, diet and exercise     F/U  Return in about 1 year (around 07/01/2019).  Alicia B. Copland, PA-C 06/30/2018 1:50 PM

## 2018-07-04 LAB — IGP, APTIMA HPV
HPV APTIMA: NEGATIVE
PAP Smear Comment: 0

## 2018-08-08 ENCOUNTER — Other Ambulatory Visit: Payer: Self-pay

## 2018-08-08 ENCOUNTER — Emergency Department: Payer: Managed Care, Other (non HMO)

## 2018-08-08 DIAGNOSIS — Z79899 Other long term (current) drug therapy: Secondary | ICD-10-CM | POA: Insufficient documentation

## 2018-08-08 DIAGNOSIS — R0602 Shortness of breath: Secondary | ICD-10-CM | POA: Insufficient documentation

## 2018-08-08 LAB — CBC
HCT: 36.2 % (ref 36.0–46.0)
HEMOGLOBIN: 11.3 g/dL — AB (ref 12.0–15.0)
MCH: 23.5 pg — ABNORMAL LOW (ref 26.0–34.0)
MCHC: 31.2 g/dL (ref 30.0–36.0)
MCV: 75.3 fL — ABNORMAL LOW (ref 80.0–100.0)
NRBC: 0 % (ref 0.0–0.2)
Platelets: 243 10*3/uL (ref 150–400)
RBC: 4.81 MIL/uL (ref 3.87–5.11)
RDW: 16.3 % — AB (ref 11.5–15.5)
WBC: 8.1 10*3/uL (ref 4.0–10.5)

## 2018-08-08 NOTE — ED Triage Notes (Signed)
Pt with shob with and without exertion for two weeks. Has seen pmd and was prescribed steroids and given a cardiology consult in future. Pt is shob at rest in triage. Breath sounds are clear. Pt denies hemoptysis but does complain of chest tightness.

## 2018-08-09 ENCOUNTER — Emergency Department: Payer: Managed Care, Other (non HMO)

## 2018-08-09 ENCOUNTER — Emergency Department
Admission: EM | Admit: 2018-08-09 | Discharge: 2018-08-09 | Disposition: A | Discharge status: Home / Self Care | Payer: Managed Care, Other (non HMO) | Attending: Emergency Medicine | Admitting: Emergency Medicine

## 2018-08-09 ENCOUNTER — Encounter: Payer: Self-pay | Admitting: Radiology

## 2018-08-09 DIAGNOSIS — R0602 Shortness of breath: Secondary | ICD-10-CM

## 2018-08-09 LAB — TROPONIN I

## 2018-08-09 LAB — BASIC METABOLIC PANEL
ANION GAP: 6 (ref 5–15)
BUN: 15 mg/dL (ref 6–20)
CHLORIDE: 105 mmol/L (ref 98–111)
CO2: 27 mmol/L (ref 22–32)
Calcium: 9.1 mg/dL (ref 8.9–10.3)
Creatinine, Ser: 1.02 mg/dL — ABNORMAL HIGH (ref 0.44–1.00)
GFR calc non Af Amer: >60 mL/min (ref >60)
Glucose, Bld: 112 mg/dL — ABNORMAL HIGH (ref 70–99)
Potassium: 4.3 mmol/L (ref 3.5–5.1)
Sodium: 138 mmol/L (ref 135–145)

## 2018-08-09 LAB — BRAIN NATRIURETIC PEPTIDE: B Natriuretic Peptide: 9 pg/mL (ref 0.0–100.0)

## 2018-08-09 MED ORDER — ALBUTEROL SULFATE (2.5 MG/3ML) 0.083% IN NEBU: 2.5000 mg | INHALATION_SOLUTION | RESPIRATORY_TRACT | 0 refills | Status: AC | PRN

## 2018-08-09 MED ORDER — IOHEXOL 350 MG/ML SOLN
75.0000 mL | Freq: Once | INTRAVENOUS | Status: AC | PRN
  Administered 2018-08-09: 75 mL via INTRAVENOUS

## 2018-08-09 MED ORDER — IPRATROPIUM-ALBUTEROL 0.5-2.5 (3) MG/3ML IN SOLN
3.0000 mL | Freq: Once | RESPIRATORY_TRACT | Status: AC
  Administered 2018-08-09: 3 mL via RESPIRATORY_TRACT
  Filled 2018-08-09: qty 3

## 2018-08-09 MED ORDER — COMPRESSOR/NEBULIZER MISC: 1.0000 [IU] | 0 refills | Status: AC | PRN

## 2018-08-09 NOTE — Discharge Instructions (Addendum)
1.  You may use albuterol nebulizer every 4 hours as needed for difficulty breathing. 2.  Return to the ER for worsening symptoms, persistent vomiting, difficulty breathing or other concerns.

## 2018-08-09 NOTE — ED Provider Notes (Signed)
[facility] Emergency Department Provider Note   ____________________________________________   First MD Initiated Contact with Patient 08/09/18 [phone]     (approximate)  I have reviewed the triage vital signs and the nursing notes.   HISTORY  Chief Complaint Shortness of Breath    HPI Kathy Jackson is a 47 y.o. female who presents to the ED from home with a chief complaint of shortness of breath.  Patient reports a 3-week history of shortness of breath exacerbated by exertion.  No history of CAD or lung issues.  She is being followed by [facility] pulmonology for lung nodule.  Saw her PCP on 11/7 for similar symptoms.  Placed on a course of steroids and referred to cardiology.  She has an appointment on December 2.  Unable to rest tonight secondary to breathing difficulty.  Denies recent fever, chills, chest pain, abdominal pain, nausea, vomiting, diarrhea.  Denies recent travel, trauma or hormone use.   Past Medical History:  Diagnosis Date  . Constipation   . Endometriosis   . Goiter   . Headache    Migraines  . History of kidney stones   . Nephrolithiasis     Patient Active Problem List   Diagnosis Date Noted  . Chronic RLQ pain 06/30/2018  . Acute bronchitis 11/27/2016  . Solitary pulmonary nodule 09/09/2013    Past Surgical History:  Procedure Laterality Date  . ABDOMINAL HYSTERECTOMY    . COLONOSCOPY WITH PROPOFOL N/A 12/13/2016   Procedure: COLONOSCOPY WITH PROPOFOL;  Surgeon: Christena DeemMartin U Skulskie, MD;  Location[facility] ENDOSCOPY;  Service: Endoscopy;  Laterality: N/A;  . ESOPHAGOGASTRODUODENOSCOPY (EGD) WITH PROPOFOL N/A 06/14/2016   Procedure: ESOPHAGOGASTRODUODENOSCOPY (EGD) WITH PROPOFOL;  Surgeon: Christena DeemMartin U Skulskie, MD;  [facility] END[facility]COPY;  Service: Endoscopy;  Laterality: N/A;  . FOOT SURGERY      Prior to Admission medications   Medication Sig Start Date End Date Taking? Authorizing Provider  albuterol (PROVENTIL  HFA;VENTOLIN HFA) 108 (90 Base) MCG/ACT inhaler Inhale 2 puffs into the lungs every 6 (six) hours as needed for wheezing or shortness of breath. 11/07/16   Phineas SemenGoodman, Graydon, MD  budesonide-formoterol [facility](SYMBICORT) 160-4.5 MCG/ACT inhaler Inhale into the lungs. 11/13/16 11/13/17  [provider]  fluticasone (FLONASE) 50 MCG/ACT nasal spray Place 2 sprays into both nostrils daily.    [provider]  meloxicam (MOBIC) 15 MG tablet Take by mouth. 06/29/18   [provider]  pantoprazole (PROTONIX) 40 MG tablet Take 40 mg by mouth daily.    [provider]  tiotropium (SPIRIVA) 18 MCG inhalation capsule Place into inhaler and inhale. 08/07/17 08/07/18  [provider]    Allergies Patient has no known allergies.  Family History  Problem Relation Age of Onset  . Breast cancer Mother 5255  . Prostate cancer Brother 7742  . Kidney failure Maternal Grandfather     Social History Social History   Tobacco Use  . Smoking status: Never Smoker  . Smokeless tobacco: Never Used  Substance Use Topics  . Alcohol use: No  . Drug use: No    Review of Systems  Constitutional: No fever/chills Eyes: No visual changes. ENT: No sore throat. Cardiovascular: Denies chest pain. Respiratory: Positive for shortness of breath. Gastrointestinal: No abdominal pain.  No nausea, no vomiting.  No diarrhea.  No constipation. Genitourinary: Negative for dysuria. Musculoskeletal: Negative for back pain. Skin: Negative for rash. Neurological: Negative for headaches, focal weakness or numbness.   ____________________________________________   PHYSICAL EXAM:  VITAL  SIGNS: ED Triage Vitals [08/08/18 2339]  Enc Vitals Group     BP (!) 127/54     Pulse Rate 73     Resp (!) 26     Temp 99 F (37.2 C)     Temp Source Oral     SpO2 100 %     Weight 191 lb (86.6 kg)     Height 5\' 4"  (1.626 m)     Head Circumference      Peak Flow      Pain Score      Pain Loc       Pain Edu?      Excl. in GC?     Constitutional: Alert and oriented. Well appearing and in mild acute distress. Eyes: Conjunctivae are normal. PERRL. EOMI. Head: Atraumatic. Nose: No congestion/rhinnorhea. Mouth/Throat: Mucous membranes are moist.  Oropharynx non-erythematous. Neck: No stridor.   Cardiovascular: Normal rate, regular rhythm. Grossly normal heart sounds.  Good peripheral circulation. Respiratory: Normal respiratory effort.  No retractions. Lungs CTAB. Gastrointestinal: Soft and nontender. No distention. No abdominal bruits. No CVA tenderness. Musculoskeletal: No lower extremity tenderness nor edema.  No joint effusions. Neurologic:  Normal speech and language. No gross focal neurologic deficits are appreciated. No gait instability. Skin:  Skin is warm, dry and intact. No rash noted. Psychiatric: Mood and affect are normal. Speech and behavior are normal.  ____________________________________________   LABS (all labs ordered are listed, but only abnormal results are displayed)  Labs Reviewed  BASIC METABOLIC PANEL - Abnormal; Notable for the following components:      Result Value   Glucose, Bld 112 (*)    Creatinine, Ser 1.02 (*)    All other components within normal limits  CBC - Abnormal; Notable for the following components:   Hemoglobin 11.3 (*)    MCV 75.3 (*)    MCH 23.5 (*)    RDW 16.3 (*)    All other components within normal limits  TROPONIN I  TROPONIN I  BRAIN NATRIURETIC PEPTIDE   ____________________________________________  EKG  ED ECG REPORT I, SUNG,JADE J, the attending physician, personally viewed and interpreted this ECG.   Date: 08/09/2018  EKG Time: 2343  Rate: 79  Rhythm: normal EKG, normal sinus rhythm  Axis: Normal  Intervals:none  ST&T Change: Nonspecific  ____________________________________________  RADIOLOGY  ED MD interpretation: No acute cardiopulmonary process  Official radiology report(s): Dg Chest 2  View  Result Date: 08/09/2018 CLINICAL DATA:  Subacute onset of shortness of breath. Chest tightness. EXAM: CHEST - 2 VIEW COMPARISON:  Chest radiograph performed 11/07/2016, and CTA of the chest performed 02/21/2017 FINDINGS: The lungs are well-aerated and clear. There is no evidence of focal opacification, pleural effusion or pneumothorax. The heart is normal in size; the mediastinal contour is within normal limits. No acute osseous abnormalities are seen. IMPRESSION: No acute cardiopulmonary process seen. Electronically Signed   By: Roanna Raider M.D.   On: 08/09/2018 00:16   Ct Angio Chest Pe W/cm &/or Wo Cm  Result Date: 08/09/2018 CLINICAL DATA:  Shortness of breath at rest. EXAM: CT ANGIOGRAPHY CHEST WITH CONTRAST TECHNIQUE: Multidetector CT imaging of the chest was performed using the standard protocol during bolus administration of intravenous contrast. Multiplanar CT image reconstructions and MIPs were obtained to evaluate the vascular anatomy. CONTRAST:  75mL OMNIPAQUE IOHEXOL 350 MG/ML SOLN COMPARISON:  CTA chest 02/21/2017 FINDINGS: Cardiovascular: --Pulmonary arteries: Contrast injection is sufficient to demonstrate satisfactory opacification of the pulmonary arteries to the segmental level. There  is no pulmonary embolus. The main pulmonary artery is within normal limits for size. --Aorta: Satisfactory opacification of the thoracic aorta. No aortic dissection or other acute aortic syndrome. Conventional 3 vessel aortic branching pattern. The aortic course and caliber are normal. There is no aortic atherosclerosis. --Heart: Moderate cardiomegaly.  No pericardial effusion. Mediastinum/Nodes: No mediastinal, hilar or axillary lymphadenopathy. The visualized thyroid and thoracic esophageal course are unremarkable. Lungs/Pleura: No pulmonary nodules or masses. No pleural effusion or pneumothorax. No focal airspace consolidation. No focal pleural abnormality. Upper Abdomen: Contrast bolus timing is  not optimized for evaluation of the abdominal organs. Within this limitation, the visualized organs of the upper abdomen are normal. Musculoskeletal: No chest wall abnormality. No acute or significant osseous findings. Review of the MIP images confirms the above findings. IMPRESSION: No pulmonary embolus or other acute thoracic abnormality. Electronically Signed   By: Deatra Robinson M.D.   On: 08/09/2018 04:24    ____________________________________________   PROCEDURES  Procedure(s) performed: None  Procedures  Critical Care performed: No  ____________________________________________   INITIAL IMPRESSION / ASSESSMENT AND PLAN / ED COURSE  As part of my medical decision making, I reviewed the following data within the electronic MEDICAL RECORD NUMBER History obtained from family, Nursing notes reviewed and incorporated, Labs reviewed, EKG interpreted, Old chart reviewed, Radiograph reviewed and Notes from prior ED visits   47 year old female who presents with dyspnea worsened by exertion. Differential includes, but is not limited to, viral syndrome, bronchitis including COPD exacerbation, pneumonia, reactive airway disease including asthma, CHF including exacerbation with or without pulmonary/interstitial edema, pneumothorax, ACS, thoracic trauma, and pulmonary embolism.  Initial EKG, x-ray and troponin unremarkable.  Will check BNP, repeat timed troponin.  Will obtain CTA chest to evaluate for PE.  Clinical Course as of Aug 10 515  Wynelle Link Aug 09, 2018  [id number] Updated patient her daughter of CT results as well as negative repeat troponin and unremarkable BNP.  I personally reviewed patient's chart and see that she had a cardiology work-up with Dr. Gwen Pounds in 2018 with negative stress test and echocardiogram with EF greater than 55%.  Patient feeling better after nebulizer treatments.  Instructed patient to keep her cardiology appointment coming up.  Strict return precautions given.  Both  verbalize understanding and agree with plan of care.   [JS]    Clinical Course User Index [JS] Irean Hong, MD     ____________________________________________   FINAL CLINICAL IMPRESSION(S) / ED DIAGNOSES  Final diagnoses:  SOB (shortness of breath)     ED Discharge Orders    None       Note:  This document was prepared using Dragon voice recognition software and may include unintentional dictation errors.    Irean Hong, MD 08/09/18 [phone]

## 2018-08-09 NOTE — ED Notes (Signed)
Patient transported to CT 

## 2018-08-09 NOTE — ED Notes (Signed)
Pt updated on care plan. Pt verbalizes understanding.  

## 2019-06-17 ENCOUNTER — Other Ambulatory Visit: Payer: Self-pay | Admitting: Internal Medicine

## 2019-06-17 DIAGNOSIS — Z1231 Encounter for screening mammogram for malignant neoplasm of breast: Secondary | ICD-10-CM

## 2019-07-19 NOTE — Progress Notes (Signed)
Chief Complaint  Patient presents with  . Gynecologic Exam  . [employer] Employee     HPI:      Ms. DANAYA GEDDIS is a 48 y.o. No obstetric history on file. who LMP was No LMP recorded. Patient has had a hysterectomy., presents today for her annual examination.  Her menses are absent due to TAH for endometriosis. Dysmenorrhea none. She does not have intermenstrual bleeding. She does have occas vasomotor sx. Pt cont to have RLQ pain mid cycle. Sx have been going on for years. Hx of RTO cyst and endometriosis. Takes NSAIDs prn sx with relief.  Sex activity: single partner, contraception - status post hysterectomy. No dyspareunia/RLQ pain. Last Pap: 06/30/18 Results were: no abnormalities /neg HPV DNA  Hx of STDs: none  Last mammogram: today, awaiting results.    There is a FH of breast cancer in her mom, genetic testing not indicated. There is no FH of ovarian cancer. Her brother had prostate cancer at a young age but it wasn't mets/chemo and lupron not needed; unsure if intraductal. Genetic testing would be indicated if his was intraductal. Pt to try to clarify and will cont to follow FH. The patient does do self-breast exams.  Tobacco use: The patient denies current or previous tobacco use. Alcohol use: none  No drug use. Exercise: moderately active  She does get adequate calcium but not Vitamin D in her diet. Labs through work.  S/P colonoscopy 3/18 with Dr. Marva Panda. Repeat after 5 yrs due to polyp.    Past Medical History:  Diagnosis Date  . Colon polyp 11/2016  . Constipation   . Endometriosis   . Goiter   . Headache    Migraines  . History of kidney stones   . Nephrolithiasis     Past Surgical History:  Procedure Laterality Date  . ABDOMINAL HYSTERECTOMY    . COLONOSCOPY WITH PROPOFOL N/A 12/13/2016   Procedure: COLONOSCOPY WITH PROPOFOL;  Surgeon: Christena Deem, MD;  Location: [facility] ENDOSCOPY;  Service: Endoscopy;  Laterality: N/A;  .  ESOPHAGOGASTRODUODENOSCOPY (EGD) WITH PROPOFOL N/A 06/14/2016   Procedure: ESOPHAGOGASTRODUODENOSCOPY (EGD) WITH PROPOFOL;  Surgeon: Christena Deem, MD;  Location: [facility] ENDOSCOPY;  Service: Endoscopy;  Laterality: N/A;  . FOOT SURGERY      Family History  Problem Relation Age of Onset  . Breast cancer Mother 69  . Prostate cancer Brother 18  . Kidney failure Maternal Grandfather     Social History   Socioeconomic History  . Marital status: Married    Spouse name: Not on file  . Number of children: 2  . Years of education: Not on file  . Highest education level: Not on file  Occupational History  . Occupation: revenue and billing  Social Needs  . Financial resource strain: Not on file  . Food insecurity    Worry: Not on file    Inability: Not on file  . Transportation needs    Medical: Not on file    Non-medical: Not on file  Tobacco Use  . Smoking status: Never Smoker  . Smokeless tobacco: Never Used  Substance and Sexual Activity  . Alcohol use: No  . Drug use: No  . Sexual activity: Yes    Birth control/protection: Surgical    Comment: Hysterectomy  Lifestyle  . Physical activity    Days per week: Not on file    Minutes per session: Not on file  . Stress: Not on file  Relationships  . Social connections  Talks on phone: Not on file    Gets together: Not on file    Attends religious service: Not on file    Active member of club or organization: Not on file    Attends meetings of clubs or organizations: Not on file    Relationship status: Not on file  . Intimate partner violence    Fear of current or ex partner: Not on file    Emotionally abused: Not on file    Physically abused: Not on file    Forced sexual activity: Not on file  Other Topics Concern  . Not on file  Social History Narrative  . Not on file     Current Outpatient Medications:  .  albuterol (PROVENTIL HFA;VENTOLIN HFA) 108 (90 Base) MCG/ACT inhaler, Inhale 2 puffs into the lungs  every 6 (six) hours as needed for wheezing or shortness of breath., Disp: 1 Inhaler, Rfl: 0 .  albuterol (PROVENTIL) (2.5 MG/3ML) 0.083% nebulizer solution, Take 3 mLs (2.5 mg total) by nebulization every 4 (four) hours as needed for wheezing or shortness of breath., Disp: 75 mL, Rfl: 0 .  fluticasone (FLONASE) 50 MCG/ACT nasal spray, Place 2 sprays into both nostrils daily., Disp: , Rfl:  .  furosemide (LASIX) 20 MG tablet, Take by mouth., Disp: , Rfl:  .  Nebulizers (COMPRESSOR/NEBULIZER) MISC, 1 Units by Does not apply route every 4 (four) hours as needed., Disp: 1 each, Rfl: 0 .  pantoprazole (PROTONIX) 40 MG tablet, Take 40 mg by mouth daily., Disp: , Rfl:  .  budesonide-formoterol (SYMBICORT) 160-4.5 MCG/ACT inhaler, Inhale into the lungs., Disp: , Rfl:  .  tiotropium (SPIRIVA) 18 MCG inhalation capsule, Place into inhaler and inhale., Disp: , Rfl:   ROS:  Review of Systems  Constitutional: Negative for fatigue, fever and unexpected weight change.  Respiratory: Positive for shortness of breath. Negative for cough and wheezing.   Cardiovascular: Negative for chest pain, palpitations and leg swelling.  Gastrointestinal: Negative for blood in stool, constipation, diarrhea, nausea and vomiting.  Endocrine: Negative for cold intolerance, heat intolerance and polyuria.  Genitourinary: Negative for dyspareunia, dysuria, flank pain, frequency, genital sores, hematuria, menstrual problem, pelvic pain, urgency, vaginal bleeding, vaginal discharge and vaginal pain.  Musculoskeletal: Positive for arthralgias. Negative for back pain, joint swelling and myalgias.  Skin: Negative for rash.  Neurological: Negative for dizziness, syncope, light-headedness, numbness and headaches.  Hematological: Negative for adenopathy.  Psychiatric/Behavioral: Positive for agitation. Negative for confusion, sleep disturbance and suicidal ideas. The patient is not nervous/anxious.      Objective: BP 116/80   Ht 5'  4" (1.626 m)   Wt 196 lb (88.9 kg)   BMI 33.64 kg/m    Physical Exam Constitutional:      Appearance: She is well-developed.  Genitourinary:     Vulva, vagina, right adnexa and left adnexa normal.     No vaginal discharge, erythema or tenderness.     Cervix is absent.     Uterus is absent.     No right or left adnexal mass present.     Right adnexa not tender.     Left adnexa not tender.     Genitourinary Comments: UTERUS/CX SURG REM  Neck:     Musculoskeletal: Normal range of motion.     Thyroid: No thyromegaly.  Cardiovascular:     Rate and Rhythm: Normal rate and regular rhythm.     Heart sounds: Normal heart sounds. No murmur.  Pulmonary:     Effort:  Pulmonary effort is normal.     Breath sounds: Normal breath sounds.  Chest:     Breasts:        Right: No mass, nipple discharge, skin change or tenderness.        Left: No mass, nipple discharge, skin change or tenderness.  Abdominal:     Palpations: Abdomen is soft.     Tenderness: There is no abdominal tenderness. There is no guarding.  Musculoskeletal: Normal range of motion.  Neurological:     General: No focal deficit present.     Mental Status: She is alert and oriented to person, place, and time.     Cranial Nerves: No cranial nerve deficit.  Skin:    General: Skin is warm and dry.  Psychiatric:        Mood and Affect: Mood normal.        Behavior: Behavior normal.        Thought Content: Thought content normal.        Judgment: Judgment normal.  Vitals signs reviewed.    Assessment/Plan: Encounter for annual routine gynecological examination  Encounter for screening mammogram for malignant neoplasm of breast; pt had mammo today.  Family history of breast cancer--pt is candidate for genetic testing if brother has intraductal prostate cancer. Pt to see if she can clarify dx. F/u prn.              GYN counsel mammography screening, menopause, adequate intake of calcium and vitamin D, diet and  exercise     F/U  Return in about 1 year (around 07/19/2020).  Alicia B. Copland, PA-C 07/20/2019 10:51 AM

## 2019-07-20 ENCOUNTER — Other Ambulatory Visit: Payer: Self-pay

## 2019-07-20 ENCOUNTER — Encounter: Payer: Self-pay | Admitting: Obstetrics and Gynecology

## 2019-07-20 ENCOUNTER — Ambulatory Visit
Admission: RE | Admit: 2019-07-20 | Discharge: 2019-07-20 | Disposition: A | Discharge status: Home / Self Care | Payer: Managed Care, Other (non HMO) | Source: Ambulatory Visit | Attending: Internal Medicine | Admitting: Internal Medicine

## 2019-07-20 ENCOUNTER — Ambulatory Visit (INDEPENDENT_AMBULATORY_CARE_PROVIDER_SITE_OTHER): Payer: Managed Care, Other (non HMO) | Admitting: Obstetrics and Gynecology

## 2019-07-20 VITALS — BP 116/80 | Ht 64.0 in | Wt 196.0 lb

## 2019-07-20 DIAGNOSIS — Z01419 Encounter for gynecological examination (general) (routine) without abnormal findings: Secondary | ICD-10-CM

## 2019-07-20 DIAGNOSIS — Z1231 Encounter for screening mammogram for malignant neoplasm of breast: Secondary | ICD-10-CM | POA: Diagnosis present

## 2019-07-20 DIAGNOSIS — Z803 Family history of malignant neoplasm of breast: Secondary | ICD-10-CM

## 2019-07-20 NOTE — Patient Instructions (Signed)
I value your feedback and entrusting us with your care. If you get a [facility] patient survey, I would appreciate you taking the time to let us know about your experience today. Thank you! 

## 2019-08-17 ENCOUNTER — Other Ambulatory Visit: Payer: Self-pay | Admitting: Orthopedic Surgery

## 2019-08-17 DIAGNOSIS — M25561 Pain in right knee: Secondary | ICD-10-CM

## 2019-08-17 DIAGNOSIS — M25461 Effusion, right knee: Secondary | ICD-10-CM

## 2019-08-29 ENCOUNTER — Other Ambulatory Visit: Payer: Self-pay

## 2019-08-29 ENCOUNTER — Ambulatory Visit
Admission: RE | Admit: 2019-08-29 | Discharge: 2019-08-29 | Disposition: A | Discharge status: Home / Self Care | Payer: Managed Care, Other (non HMO) | Source: Ambulatory Visit | Attending: Orthopedic Surgery | Admitting: Orthopedic Surgery

## 2019-08-29 DIAGNOSIS — M25461 Effusion, right knee: Secondary | ICD-10-CM | POA: Insufficient documentation

## 2019-08-29 DIAGNOSIS — M25561 Pain in right knee: Secondary | ICD-10-CM | POA: Diagnosis present

## 2020-06-21 ENCOUNTER — Other Ambulatory Visit: Payer: Self-pay | Admitting: Internal Medicine

## 2020-06-21 DIAGNOSIS — Z1231 Encounter for screening mammogram for malignant neoplasm of breast: Secondary | ICD-10-CM

## 2020-07-05 ENCOUNTER — Other Ambulatory Visit: Payer: Self-pay | Admitting: Internal Medicine

## 2020-07-05 ENCOUNTER — Other Ambulatory Visit (HOSPITAL_COMMUNITY): Payer: Self-pay | Admitting: Internal Medicine

## 2020-07-05 DIAGNOSIS — R0602 Shortness of breath: Secondary | ICD-10-CM

## 2020-07-05 DIAGNOSIS — R0789 Other chest pain: Secondary | ICD-10-CM

## 2020-07-14 ENCOUNTER — Ambulatory Visit: Admission: RE | Admit: 2020-07-14 | Payer: Managed Care, Other (non HMO) | Source: Ambulatory Visit

## 2020-07-20 ENCOUNTER — Ambulatory Visit
Admission: RE | Admit: 2020-07-20 | Discharge: 2020-07-20 | Disposition: A | Discharge status: Home / Self Care | Payer: Managed Care, Other (non HMO) | Source: Ambulatory Visit | Attending: Internal Medicine | Admitting: Internal Medicine

## 2020-07-20 ENCOUNTER — Other Ambulatory Visit: Payer: Self-pay

## 2020-07-20 DIAGNOSIS — Z1231 Encounter for screening mammogram for malignant neoplasm of breast: Secondary | ICD-10-CM | POA: Insufficient documentation

## 2020-10-24 ENCOUNTER — Encounter: Payer: Self-pay | Admitting: Obstetrics and Gynecology

## 2020-10-24 ENCOUNTER — Other Ambulatory Visit: Payer: Self-pay

## 2020-10-24 ENCOUNTER — Ambulatory Visit (INDEPENDENT_AMBULATORY_CARE_PROVIDER_SITE_OTHER): Payer: Managed Care, Other (non HMO) | Admitting: Obstetrics and Gynecology

## 2020-10-24 VITALS — BP 100/70 | Ht 64.0 in | Wt 203.0 lb

## 2020-10-24 DIAGNOSIS — Z803 Family history of malignant neoplasm of breast: Secondary | ICD-10-CM | POA: Diagnosis not present

## 2020-10-24 DIAGNOSIS — Z01419 Encounter for gynecological examination (general) (routine) without abnormal findings: Secondary | ICD-10-CM

## 2020-10-24 DIAGNOSIS — Z1231 Encounter for screening mammogram for malignant neoplasm of breast: Secondary | ICD-10-CM

## 2020-10-24 NOTE — Progress Notes (Signed)
Chief Complaint  Patient presents with  . Gynecologic Exam    No concerns  . [employer] Employee     HPI:      Ms. Kathy Jackson is a 50 y.o. No obstetric history on file. who LMP was No LMP recorded. Patient has had a hysterectomy., presents today for her annual examination.  Her menses are absent due to TAH for endometriosis. Dysmenorrhea none. She does not have PMB. She does have tolerable, sometimes terrible, vasomotor sx. Pt cont to have RLQ pain mid cycle; no change since last yr. Sx have been going on for years. Hx of RTO cyst and endometriosis. Takes NSAIDs prn sx with relief.  Sex activity: single partner, contraception - status post hysterectomy. No dyspareunia/RLQ pain. Last Pap: 06/30/18 Results were: no abnormalities /neg HPV DNA ; no longer indicated Hx of STDs: none  Last mammogram: 07/20/20  Results were normal, repeat in 12 months    There is a FH of breast cancer in her mom (doesn't know if triple neg), genetic testing not indicated. There is no FH of ovarian cancer. Her brother had prostate cancer at a young age but it wasn't mets/chemo and lupron not needed; unsure if intraductal. Genetic testing would be indicated if his spreads or need tx. Will cont to follow FH. The patient does do self-breast exams.  Tobacco use: The patient denies current or previous tobacco use. Alcohol use: none  No drug use. Exercise: moderately active  Colonoscopy 3/18 with Dr. Marva Panda. Repeat after 5 yrs due to polyp.   She does get adequate calcium and Vitamin D in her diet. Labs through work.    Past Medical History:  Diagnosis Date  . Colon polyp 11/2016  . Constipation   . Endometriosis   . Goiter   . Headache    Migraines  . History of kidney stones   . Nephrolithiasis     Past Surgical History:  Procedure Laterality Date  . ABDOMINAL HYSTERECTOMY    . COLONOSCOPY WITH PROPOFOL N/A 12/13/2016   Procedure: COLONOSCOPY WITH PROPOFOL;  Surgeon: Christena Deem, MD;   Location: [facility] ENDOSCOPY;  Service: Endoscopy;  Laterality: N/A;  . ESOPHAGOGASTRODUODENOSCOPY (EGD) WITH PROPOFOL N/A 06/14/2016   Procedure: ESOPHAGOGASTRODUODENOSCOPY (EGD) WITH PROPOFOL;  Surgeon: Christena Deem, MD;  Location: [facility] ENDOSCOPY;  Service: Endoscopy;  Laterality: N/A;  . FOOT SURGERY      Family History  Problem Relation Age of Onset  . Breast cancer Mother 87  . Prostate cancer Brother 81  . Kidney failure Maternal Grandfather     Social History   Socioeconomic History  . Marital status: Married    Spouse name: Not on file  . Number of children: 2  . Years of education: Not on file  . Highest education level: Not on file  Occupational History  . Occupation: revenue and billing  Tobacco Use  . Smoking status: Never Smoker  . Smokeless tobacco: Never Used  Vaping Use  . Vaping Use: Never used  Substance and Sexual Activity  . Alcohol use: No  . Drug use: No  . Sexual activity: Yes    Birth control/protection: Surgical    Comment: Hysterectomy  Other Topics Concern  . Not on file  Social History Narrative  . Not on file   Social Determinants of Health   Financial Resource Strain: Not on file  Food Insecurity: Not on file  Transportation Needs: Not on file  Physical Activity: Not on file  Stress: Not on  file  Social Connections: Not on file  Intimate Partner Violence: Not on file     Current Outpatient Medications:  .  albuterol (PROVENTIL HFA;VENTOLIN HFA) 108 (90 Base) MCG/ACT inhaler, Inhale 2 puffs into the lungs every 6 (six) hours as needed for wheezing or shortness of breath., Disp: 1 Inhaler, Rfl: 0 .  albuterol (PROVENTIL) (2.5 MG/3ML) 0.083% nebulizer solution, Take 3 mLs (2.5 mg total) by nebulization every 4 (four) hours as needed for wheezing or shortness of breath., Disp: 75 mL, Rfl: 0 .  ferrous sulfate 325 (65 FE) MG EC tablet, Take by mouth., Disp: , Rfl:  .  fluticasone (FLONASE) 50 MCG/ACT nasal spray, Place 2 sprays into both  nostrils daily., Disp: , Rfl:  .  Multiple Vitamin (MULTI-VITAMIN) tablet, Take by mouth., Disp: , Rfl:  .  Nebulizers (COMPRESSOR/NEBULIZER) MISC, 1 Units by Does not apply route every 4 (four) hours as needed., Disp: 1 each, Rfl: 0 .  nystatin (MYCOSTATIN/NYSTOP) powder, Apply topically., Disp: , Rfl:  .  pantoprazole (PROTONIX) 40 MG tablet, Take 40 mg by mouth daily., Disp: , Rfl:  .  traZODone (DESYREL) 50 MG tablet, , Disp: , Rfl:  .  budesonide-formoterol (SYMBICORT) 160-4.5 MCG/ACT inhaler, Inhale into the lungs., Disp: , Rfl:  .  furosemide (LASIX) 20 MG tablet, Take by mouth., Disp: , Rfl:  .  tiotropium (SPIRIVA) 18 MCG inhalation capsule, Place into inhaler and inhale., Disp: , Rfl:   ROS:  Review of Systems  Constitutional: Negative for fatigue, fever and unexpected weight change.  Respiratory: Positive for shortness of breath. Negative for cough and wheezing.   Cardiovascular: Negative for chest pain, palpitations and leg swelling.  Gastrointestinal: Negative for blood in stool, constipation, diarrhea, nausea and vomiting.  Endocrine: Negative for cold intolerance, heat intolerance and polyuria.  Genitourinary: Negative for dyspareunia, dysuria, flank pain, frequency, genital sores, hematuria, menstrual problem, pelvic pain, urgency, vaginal bleeding, vaginal discharge and vaginal pain.  Musculoskeletal: Positive for arthralgias. Negative for back pain, joint swelling and myalgias.  Skin: Negative for rash.  Neurological: Positive for headaches. Negative for dizziness, syncope, light-headedness and numbness.  Hematological: Negative for adenopathy.  Psychiatric/Behavioral: Positive for agitation. Negative for confusion, sleep disturbance and suicidal ideas. The patient is not nervous/anxious.      Objective: BP 100/70   Ht 5\' 4"  (1.626 m)   Wt 203 lb (92.1 kg)   BMI 34.84 kg/m    Physical Exam Constitutional:      Appearance: She is well-developed.  Genitourinary:      Vulva normal.     Genitourinary Comments: UTERUS/CX SURG REM     Right Labia: No rash, tenderness or lesions.    Left Labia: No tenderness, lesions or rash.    No vaginal discharge, erythema or tenderness.      Right Adnexa: not tender and no mass present.    Left Adnexa: not tender and no mass present.    Cervix is absent.     Uterus is not tender.     Uterus is absent.  Breasts:     Right: No mass, nipple discharge, skin change or tenderness.     Left: No mass, nipple discharge, skin change or tenderness.    Neck:     Thyroid: No thyromegaly.  Cardiovascular:     Rate and Rhythm: Normal rate and regular rhythm.     Heart sounds: Normal heart sounds. No murmur heard.   Pulmonary:     Effort: Pulmonary effort is normal.  Breath sounds: Normal breath sounds.  Abdominal:     Palpations: Abdomen is soft.     Tenderness: There is no abdominal tenderness. There is no guarding or rebound.  Musculoskeletal:        General: Normal range of motion.     Cervical back: Normal range of motion.  Lymphadenopathy:     Cervical: No cervical adenopathy.  Neurological:     General: No focal deficit present.     Mental Status: She is alert and oriented to person, place, and time.     Cranial Nerves: No cranial nerve deficit.  Skin:    General: Skin is warm and dry.  Psychiatric:        Mood and Affect: Mood normal.        Behavior: Behavior normal.        Thought Content: Thought content normal.        Judgment: Judgment normal.  Vitals reviewed.    Assessment/Plan: Encounter for annual routine gynecological examination  Encounter for screening mammogram for malignant neoplasm of breast - Plan: MM 3D SCREEN BREAST BILATERAL; pt to sched mammo 10/21  Family history of breast cancer--will cont to follow FH. Doesn't qualify for cancer genetic testing as of now.              GYN counsel mammography screening, menopause, adequate intake of calcium and vitamin D, diet and  exercise     F/U  Return in about 1 year (around 10/24/2021).  Anner Baity B. Marion Seese, PA-C 10/24/2020 5:03 PM

## 2020-10-24 NOTE — Patient Instructions (Addendum)
I value your feedback and you entrusting us with your care. If you get a [facility] patient survey, I would appreciate you taking the time to let us know about your experience today. Thank you!  [facility]: [phone]      

## 2021-09-12 ENCOUNTER — Other Ambulatory Visit: Payer: Self-pay | Admitting: Internal Medicine

## 2021-09-12 DIAGNOSIS — E669 Obesity, unspecified: Secondary | ICD-10-CM

## 2021-09-12 DIAGNOSIS — M7989 Other specified soft tissue disorders: Secondary | ICD-10-CM

## 2021-09-19 ENCOUNTER — Ambulatory Visit
Admission: RE | Admit: 2021-09-19 | Discharge: 2021-09-19 | Disposition: A | Discharge status: Home / Self Care | Payer: Managed Care, Other (non HMO) | Source: Ambulatory Visit | Attending: Internal Medicine | Admitting: Internal Medicine

## 2021-09-19 DIAGNOSIS — E669 Obesity, unspecified: Secondary | ICD-10-CM | POA: Insufficient documentation

## 2021-09-19 DIAGNOSIS — M7989 Other specified soft tissue disorders: Secondary | ICD-10-CM | POA: Diagnosis present

## 2021-10-01 ENCOUNTER — Ambulatory Visit
Admission: RE | Admit: 2021-10-01 | Discharge: 2021-10-01 | Disposition: A | Discharge status: Home / Self Care | Payer: Managed Care, Other (non HMO) | Source: Ambulatory Visit | Attending: Obstetrics and Gynecology | Admitting: Obstetrics and Gynecology

## 2021-10-01 ENCOUNTER — Other Ambulatory Visit: Payer: Self-pay

## 2021-10-01 DIAGNOSIS — Z1231 Encounter for screening mammogram for malignant neoplasm of breast: Secondary | ICD-10-CM | POA: Diagnosis not present

## 2021-11-16 ENCOUNTER — Other Ambulatory Visit: Payer: Self-pay | Admitting: Student

## 2021-11-16 DIAGNOSIS — M7581 Other shoulder lesions, right shoulder: Secondary | ICD-10-CM

## 2021-11-16 DIAGNOSIS — M7501 Adhesive capsulitis of right shoulder: Secondary | ICD-10-CM

## 2021-11-16 DIAGNOSIS — M7521 Bicipital tendinitis, right shoulder: Secondary | ICD-10-CM

## 2021-11-16 DIAGNOSIS — M7541 Impingement syndrome of right shoulder: Secondary | ICD-10-CM

## 2021-11-24 ENCOUNTER — Encounter (HOSPITAL_BASED_OUTPATIENT_CLINIC_OR_DEPARTMENT_OTHER): Payer: Self-pay | Admitting: Emergency Medicine

## 2021-11-24 ENCOUNTER — Other Ambulatory Visit: Payer: Self-pay

## 2021-11-24 ENCOUNTER — Emergency Department (HOSPITAL_BASED_OUTPATIENT_CLINIC_OR_DEPARTMENT_OTHER)
Admission: EM | Admit: 2021-11-24 | Discharge: 2021-11-24 | Disposition: A | Discharge status: Home / Self Care | Payer: Managed Care, Other (non HMO) | Attending: Emergency Medicine | Admitting: Emergency Medicine

## 2021-11-24 DIAGNOSIS — S6991XA Unspecified injury of right wrist, hand and finger(s), initial encounter: Secondary | ICD-10-CM | POA: Diagnosis present

## 2021-11-24 DIAGNOSIS — W268XXA Contact with other sharp object(s), not elsewhere classified, initial encounter: Secondary | ICD-10-CM | POA: Insufficient documentation

## 2021-11-24 DIAGNOSIS — S61411A Laceration without foreign body of right hand, initial encounter: Secondary | ICD-10-CM | POA: Insufficient documentation

## 2021-11-24 DIAGNOSIS — Z23 Encounter for immunization: Secondary | ICD-10-CM | POA: Diagnosis not present

## 2021-11-24 MED ORDER — TETANUS-DIPHTH-ACELL PERTUSSIS 5-2.5-18.5 LF-MCG/0.5 IM SUSY
0.5000 mL | PREFILLED_SYRINGE | Freq: Once | INTRAMUSCULAR | Status: AC
  Administered 2021-11-24: 0.5 mL via INTRAMUSCULAR
  Filled 2021-11-24: qty 0.5

## 2021-11-24 MED ORDER — LIDOCAINE HCL (PF) 1 % IJ SOLN
10.0000 mL | Freq: Once | INTRAMUSCULAR | Status: AC
  Administered 2021-11-24: 10 mL
  Filled 2021-11-24: qty 10

## 2021-11-24 NOTE — ED Triage Notes (Signed)
Pt states she cut her right hand on aluminium foil package. Hand is bandaged and no signs of bleeding at this time. ?

## 2021-11-24 NOTE — ED Provider Notes (Signed)
?[facility] EMERGENCY DEPT ?Provider Note ? ? ?CSN: [id number] ?Arrival date & time: 11/24/21  1427 ? ?  ? ?History ? ?Chief Complaint  ?Patient presents with  ? Hand Injury  ? ? ?Kathy Jackson is a 51 y.o. female with no segment past medical history who presents with laceration of the right hand at the base of the fifth digit.  Patient reports that laceration was sustained with an aluminum foil container.  Patient does not know when her last tetanus shot was.  Patient denies any numbness or tingling of the affected finger.  She is not taking any blood thinners. ? ? ?Hand Injury ? ?  ? ?Home Medications ?Prior to Admission medications   ?Medication Sig Start Date End Date Taking? Authorizing Provider  ?albuterol (PROVENTIL HFA;VENTOLIN HFA) 108 (90 Base) MCG/ACT inhaler Inhale 2 puffs into the lungs every 6 (six) hours as needed for wheezing or shortness of breath. 11/07/16   Phineas Semen, MD  ?albuterol (PROVENTIL) (2.5 MG/3ML) 0.083% nebulizer solution Take 3 mLs (2.5 mg total) by nebulization every 4 (four) hours as needed for wheezing or shortness of breath. 08/09/18   Irean Hong, MD  ?budesonide-formoterol [facility]) 160-4.5 MCG/ACT inhaler Inhale into the lungs. 11/13/16 11/13/17  [provider]  ?ferrous sulfate 325 (65 FE) MG EC tablet Take by mouth. 12/22/19 12/21/20  [provider]  ?fluticasone (FLONASE) 50 MCG/ACT nasal spray Place 2 sprays into both nostrils daily.    [provider]  ?furosemide (LASIX) 20 MG tablet Take by mouth. 01/22/19 01/22/20  [provider]  ?Multiple Vitamin (MULTI-VITAMIN) tablet Take by mouth.    [provider]  ?Nebulizers (COMPRESSOR/NEBULIZER) MISC 1 Units by Does not apply route every 4 (four) hours as needed. 08/09/18   Irean Hong, MD  ?pantoprazole (PROTONIX) 40 MG tablet Take 40 mg by mouth daily.    [provider]  ?tiotropium (SPIRIVA) 18 MCG inhalation capsule Place into inhaler and inhale.  08/07/17 08/07/18  [provider]  ?traZODone (DESYREL) 50 MG tablet  10/05/20   [provider]  ?   ? ?Allergies    ?Patient has no known allergies.   ? ?Review of Systems   ?Review of Systems  ?Skin:  Positive for wound.  ?All other systems reviewed and are negative. ? ?Physical Exam ?Updated Vital Signs ?BP 138/87 (BP Location: Right Arm)   Pulse 70   Temp 98.7 ?F (37.1 ?C) (Oral)   Resp 18   Ht 5\' 4"  (1.626 m)   Wt 86.2 kg   SpO2 100%   BMI 32.61 kg/m?  ?Physical Exam ?Vitals and nursing note reviewed.  ?Constitutional:   ?   General: She is not in acute distress. ?   Appearance: Normal appearance.  ?HENT:  ?   Head: Normocephalic and atraumatic.  ?Eyes:  ?   General:     ?   Right eye: No discharge.     ?   Left eye: No discharge.  ?Cardiovascular:  ?   Rate and Rhythm: Normal rate and regular rhythm.  ?   Pulses: Normal pulses.  ?Pulmonary:  ?   Effort: Pulmonary effort is normal. No respiratory distress.  ?Musculoskeletal:     ?   General: No deformity.  ?   Comments: Intact flexion, extension of the entire 5th digit on the right  ?Skin: ?   General: Skin is warm and dry.  ?   Capillary Refill: Capillary refill takes less than 2 seconds.  Distal capillary refill intact ?   Comments: Patient with approximately 2 to 3 cm shallow laceration at the base of the right fifth digit on the palmar side.  There is some slow active bleeding at time of my evaluation.  No retained foreign bodies noted, entire length of wound visualized.  ?Neurological:  ?   Mental Status: She is alert and oriented to person, place, and time.  ?Psychiatric:     ?   Mood and Affect: Mood normal.     ?   Behavior: Behavior normal.  ? ? ?ED Results / Procedures / Treatments   ?Labs ?(all labs ordered are listed, but only abnormal results are displayed) ?Labs Reviewed - No data to display ? ?EKG ?None ? ?Radiology ?No results found. ? ?Procedures ?Marland Kitchen.Laceration Repair ? ?Date/Time: 11/24/2021 5:47 PM ?Performed by:  Olene Floss, PA-C ?Authorized by: Olene Floss, PA-C  ? ?Consent:  ?  Consent obtained:  Verbal ?  Consent given by:  Patient ?  Risks, benefits, and alternatives were discussed: yes   ?  Risks discussed:  Infection, pain, poor cosmetic result and poor wound healing ?  Alternatives discussed:  No treatment ?Universal protocol:  ?  Procedure explained and questions answered to patient or proxy's satisfaction: yes   ?  Patient identity confirmed:  Verbally with patient ?Anesthesia:  ?  Anesthesia method:  Local infiltration ?  Local anesthetic:  Lidocaine 1% w/o epi ?Laceration details:  ?  Location:  Hand ?  Hand location:  R palm ?  Length (cm):  2.6 ?  Depth (mm):  3 ?Treatment:  ?  Area cleansed with:  Shur-Clens and soap and water ?  Amount of cleaning:  Standard ?Skin repair:  ?  Repair method:  Sutures ?  Suture size:  5-0 ?  Suture material:  Chromic gut ?  Suture technique:  Simple interrupted ?  Number of sutures:  3 ?Approximation:  ?  Approximation:  Close ?Repair type:  ?  Repair type:  Simple ?Post-procedure details:  ?  Dressing:  Non-adherent dressing and antibiotic ointment ?  Procedure completion:  Tolerated  ? ? ?Medications Ordered in ED ?Medications  ?Tdap (BOOSTRIX) injection 0.5 mL (0.5 mLs Intramuscular Given 11/24/21 1619)  ?lidocaine (PF) (XYLOCAINE) 1 % injection 10 mL (10 mLs Infiltration Given 11/24/21 1618)  ? ? ?ED Course/ Medical Decision Making/ A&P ?  ?                        ?Medical Decision Making ?Risk ?Prescription drug management. ? ? ?Is an overall well-appearing patient who presents with simple laceration of the palmar aspect of the right hand.  Discussed glue versus sutures.  Patient reports that she uses the hand a lot, and it is at the bottom of the fifth finger so has a lot of motion.  Discussed I would recommend closure with sutures.  Laceration repaired as described above.  Patient up-to-date on her tetanus.  She has no evidence of tendon injury, or  retained foreign body.  Encouraged to keep wound clean.  Extensive return precautions given for signs of infection, flexor tenosynovitis.  Patient understands and agrees to plan, discharged in stable condition at this time. ?Final Clinical Impression(s) / ED Diagnoses ?Final diagnoses:  ?Laceration of right hand without foreign body, initial encounter  ? ? ?Rx / DC Orders ?ED Discharge Orders   ? ? None  ? ?  ? ? ?  ?Jamillia Closson, [employer]  H, PA-C ?11/24/21 1749 ? ?  ?Pricilla Loveless, MD ?11/25/21 0710 ? ?

## 2021-11-24 NOTE — Discharge Instructions (Signed)
As we discussed you had a nice clean wound, and I placed dissolvable sutures which should dissolve on their own.  However they can take several weeks to dissolve, if they are bothering you you can have them removed in around 10 to 14 days.  Please keep the wound clean and dry, place some antibacterial ointment such as bacitracin, Polysporin, or Neosporin on the affected area and cover with a bandage.  Please return if you notice any signs of infection, including redness, swelling, pus draining from the wound site, or worsening pain. ?

## 2021-11-26 ENCOUNTER — Ambulatory Visit: Payer: Managed Care, Other (non HMO)

## 2022-10-02 ENCOUNTER — Other Ambulatory Visit: Payer: Self-pay | Admitting: Internal Medicine

## 2022-10-02 DIAGNOSIS — Z1231 Encounter for screening mammogram for malignant neoplasm of breast: Secondary | ICD-10-CM

## 2022-12-26 ENCOUNTER — Ambulatory Visit
Admission: RE | Admit: 2022-12-26 | Discharge: 2022-12-26 | Disposition: A | Discharge status: Home / Self Care | Payer: Managed Care, Other (non HMO) | Source: Ambulatory Visit | Attending: Internal Medicine | Admitting: Internal Medicine

## 2022-12-26 DIAGNOSIS — Z1231 Encounter for screening mammogram for malignant neoplasm of breast: Secondary | ICD-10-CM | POA: Diagnosis present

## 2023-05-12 NOTE — Progress Notes (Unsigned)
No chief complaint on file.    HPI:      Kathy Jackson is a 52 y.o. No obstetric history on file. who LMP was No LMP recorded. Patient has had a hysterectomy., presents today for her annual examination.  Her menses are absent due to TAH for endometriosis. Dysmenorrhea none. She does not have PMB. She does have tolerable, sometimes terrible, vasomotor sx. Pt cont to have RLQ pain mid cycle; no change since last yr. Sx have been going on for years. Hx of RTO cyst and endometriosis. Takes NSAIDs prn sx with relief.  Sex activity: single partner, contraception - status post hysterectomy. No dyspareunia/RLQ pain. Last Pap: 06/30/18 Results were: no abnormalities /neg HPV DNA ; no longer indicated Hx of STDs: none  Last mammogram: 12/26/22 Results were normal, repeat in 12 months    There is a FH of breast cancer in her mom (doesn't know if triple neg), genetic testing not indicated. There is no FH of ovarian cancer. Her brother had prostate cancer at a young age but it wasn't mets/chemo and lupron not needed; unsure if intraductal. Genetic testing would be indicated if his spreads or need tx. Will cont to follow FH. The patient does do self-breast exams.  Tobacco use: The patient denies current or previous tobacco use. Alcohol use: none  No drug use. Exercise: moderately active  Colonoscopy 3/18 with Dr. Marva Panda. Repeat after 5 yrs due to polyp.   She does get adequate calcium and Vitamin D in her diet. Labs through work.    Past Medical History:  Diagnosis Date   Colon polyp 11/2016   Constipation    Endometriosis    Goiter    Headache    Migraines   History of kidney stones    Nephrolithiasis     Past Surgical History:  Procedure Laterality Date   ABDOMINAL HYSTERECTOMY     COLONOSCOPY WITH PROPOFOL N/A 12/13/2016   Procedure: COLONOSCOPY WITH PROPOFOL;  Surgeon: Christena Deem, MD;  Location: [facility] ENDOSCOPY;  Service: Endoscopy;  Laterality: N/A;    ESOPHAGOGASTRODUODENOSCOPY (EGD) WITH PROPOFOL N/A 06/14/2016   Procedure: ESOPHAGOGASTRODUODENOSCOPY (EGD) WITH PROPOFOL;  Surgeon: Christena Deem, MD;  Location: [facility] ENDOSCOPY;  Service: Endoscopy;  Laterality: N/A;   FOOT SURGERY      Family History  Problem Relation Age of Onset   Breast cancer Mother 6   Breast cancer Maternal Aunt    Breast cancer Maternal Aunt    Breast cancer Maternal Aunt    Kidney failure Maternal Grandfather    Prostate cancer Brother 43    Social History   Socioeconomic History   Marital status: Married    Spouse name: Not on file   Number of children: 2   Years of education: Not on file   Highest education level: Not on file  Occupational History   Occupation: revenue and billing  Tobacco Use   Smoking status: Never   Smokeless tobacco: Never  Vaping Use   Vaping status: Never Used  Substance and Sexual Activity   Alcohol use: No   Drug use: No   Sexual activity: Yes    Birth control/protection: Surgical    Comment: Hysterectomy  Other Topics Concern   Not on file  Social History Narrative   Not on file   Social Determinants of Health   Financial Resource Strain: Not on file  Food Insecurity: Not on file  Transportation Needs: Not on file  Physical Activity: Not on file  Stress: Not on file  Social Connections: Unknown (02/05/2022)   [facility]   Social Network    Social Network: Not on file  Intimate Partner Violence: Unknown (12/28/2021)   Received from [facility], [facility]   HITS    Physically Hurt: Not on file    Insult or Talk Down To: Not on file    Threaten Physical Harm: Not on file    Scream or Curse: Not on file     Current Outpatient Medications:    albuterol (PROVENTIL HFA;VENTOLIN HFA) 108 (90 Base) MCG/ACT inhaler, Inhale 2 puffs into the lungs every 6 (six) hours as needed for wheezing or shortness of breath., Disp: 1 Inhaler, Rfl: 0   albuterol (PROVENTIL) (2.5  MG/3ML) 0.083% nebulizer solution, Take 3 mLs (2.5 mg total) by nebulization every 4 (four) hours as needed for wheezing or shortness of breath., Disp: 75 mL, Rfl: 0   budesonide-formoterol (SYMBICORT) 160-4.5 MCG/ACT inhaler, Inhale into the lungs., Disp: , Rfl:    ferrous sulfate 325 (65 FE) MG EC tablet, Take by mouth., Disp: , Rfl:    fluticasone (FLONASE) 50 MCG/ACT nasal spray, Place 2 sprays into both nostrils daily., Disp: , Rfl:    furosemide (LASIX) 20 MG tablet, Take by mouth., Disp: , Rfl:    Multiple Vitamin (MULTI-VITAMIN) tablet, Take by mouth., Disp: , Rfl:    Nebulizers (COMPRESSOR/NEBULIZER) MISC, 1 Units by Does not apply route every 4 (four) hours as needed., Disp: 1 each, Rfl: 0   pantoprazole (PROTONIX) 40 MG tablet, Take 40 mg by mouth daily., Disp: , Rfl:    tiotropium (SPIRIVA) 18 MCG inhalation capsule, Place into inhaler and inhale., Disp: , Rfl:    traZODone (DESYREL) 50 MG tablet, , Disp: , Rfl:   ROS:  Review of Systems  Constitutional:  Negative for fatigue, fever and unexpected weight change.  Respiratory:  Positive for shortness of breath. Negative for cough and wheezing.   Cardiovascular:  Negative for chest pain, palpitations and leg swelling.  Gastrointestinal:  Negative for blood in stool, constipation, diarrhea, nausea and vomiting.  Endocrine: Negative for cold intolerance, heat intolerance and polyuria.  Genitourinary:  Negative for dyspareunia, dysuria, flank pain, frequency, genital sores, hematuria, menstrual problem, pelvic pain, urgency, vaginal bleeding, vaginal discharge and vaginal pain.  Musculoskeletal:  Positive for arthralgias. Negative for back pain, joint swelling and myalgias.  Skin:  Negative for rash.  Neurological:  Positive for headaches. Negative for dizziness, syncope, light-headedness and numbness.  Hematological:  Negative for adenopathy.  Psychiatric/Behavioral:  Positive for agitation. Negative for confusion, sleep disturbance  and suicidal ideas. The patient is not nervous/anxious.      Objective: There were no vitals taken for this visit.   Physical Exam Constitutional:      Appearance: She is well-developed.  Genitourinary:     Vulva normal.     Genitourinary Comments: UTERUS/CX SURG REM     Right Labia: No rash, tenderness or lesions.    Left Labia: No tenderness, lesions or rash.    No vaginal discharge, erythema or tenderness.      Right Adnexa: not tender and no mass present.    Left Adnexa: not tender and no mass present.    Cervix is absent.     Uterus is not tender.     Uterus is absent.  Breasts:    Right: No mass, nipple discharge, skin change or tenderness.     Left: No mass, nipple discharge, skin  change or tenderness.  Neck:     Thyroid: No thyromegaly.  Cardiovascular:     Rate and Rhythm: Normal rate and regular rhythm.     Heart sounds: Normal heart sounds. No murmur heard. Pulmonary:     Effort: Pulmonary effort is normal.     Breath sounds: Normal breath sounds.  Abdominal:     Palpations: Abdomen is soft.     Tenderness: There is no abdominal tenderness. There is no guarding or rebound.  Musculoskeletal:        General: Normal range of motion.     Cervical back: Normal range of motion.  Lymphadenopathy:     Cervical: No cervical adenopathy.  Neurological:     General: No focal deficit present.     Mental Status: She is alert and oriented to person, place, and time.     Cranial Nerves: No cranial nerve deficit.  Skin:    General: Skin is warm and dry.  Psychiatric:        Mood and Affect: Mood normal.        Behavior: Behavior normal.        Thought Content: Thought content normal.        Judgment: Judgment normal.  Vitals reviewed.    Assessment/Plan: Encounter for annual routine gynecological examination  Encounter for screening mammogram for malignant neoplasm of breast - Plan: MM 3D SCREEN BREAST BILATERAL; pt to sched mammo 10/21  Family history of breast  cancer--will cont to follow FH. Doesn't qualify for cancer genetic testing as of now.              GYN counsel mammography screening, menopause, adequate intake of calcium and vitamin D, diet and exercise     F/U  No follow-ups on file.  Orli Degrave B. Jamel Holzmann, PA-C 05/12/2023 9:22 PM

## 2023-05-13 ENCOUNTER — Encounter: Payer: Self-pay | Admitting: Obstetrics and Gynecology

## 2023-05-13 ENCOUNTER — Ambulatory Visit (INDEPENDENT_AMBULATORY_CARE_PROVIDER_SITE_OTHER): Payer: Managed Care, Other (non HMO) | Admitting: Obstetrics and Gynecology

## 2023-05-13 VITALS — BP 101/77 | HR 72 | Ht 64.0 in | Wt 168.7 lb

## 2023-05-13 DIAGNOSIS — Z01419 Encounter for gynecological examination (general) (routine) without abnormal findings: Secondary | ICD-10-CM | POA: Diagnosis not present

## 2023-05-13 DIAGNOSIS — Z803 Family history of malignant neoplasm of breast: Secondary | ICD-10-CM

## 2023-05-13 DIAGNOSIS — Z1211 Encounter for screening for malignant neoplasm of colon: Secondary | ICD-10-CM

## 2023-05-13 DIAGNOSIS — Z1231 Encounter for screening mammogram for malignant neoplasm of breast: Secondary | ICD-10-CM

## 2023-05-13 NOTE — Patient Instructions (Signed)
I value your feedback and you entrusting us with your care. If you get a [facility] patient survey, I would appreciate you taking the time to let us know about your experience today. Thank you! ? ? ?

## 2023-05-19 ENCOUNTER — Telehealth: Payer: Self-pay

## 2023-05-19 NOTE — Telephone Encounter (Signed)
Pt called triage saying genetic testing is not covered under her plan. She would like to know what genetic testing is covered through [employer].

## 2023-05-21 NOTE — Telephone Encounter (Signed)
Pls check [facility] to see if any issues with her test. Pt may be getting confused that she needs to talk to [facility] first (same with LC our [facility] test)

## 2023-05-27 NOTE — Telephone Encounter (Signed)
Talked to [place] at [place], she doesn't have anything in patient's record test not covered. She believes it has to do with [facility] not covered by her insurance. New referral to [facility] for genetic counseling has been put in. She advised to let the pt know to hang in there. Pt aware.

## 2023-05-28 ENCOUNTER — Telehealth: Payer: Self-pay

## 2023-05-28 NOTE — Telephone Encounter (Signed)
Kathy Jackson from [employer]; hasn't gotten the paperwork from the [facility]; pt didn't complete appt and not compliant; [place] said ins does not cover the test; does that mean they are out of network?  They can send to another genetic counseling company; will need new referral which she can fax to [place] if needed.  Please verify what's going on.  [phone] ext [phone]

## 2023-05-29 NOTE — Telephone Encounter (Signed)
Pls f/u on this. Pt def meets testing guidelines. Has to do GC appt whether she uses [facility] or [facility].

## 2023-06-02 NOTE — Telephone Encounter (Signed)
Talked to [name], she said attempts have been made so she can get appt with [facility]. One more attempt going to be made. I talked to [name] about conversation with [name] (on a different msg in pt's chart) she said she doesn't have that [facility] is out of pt's insurance network and that in order for another referral to a third party genetic counselor be made they had to fax a form so [name] could fill out. I advised to go ahead and fax it. I have also called pt to let her know what's going on. I told her [name] called earlier this morning and left a message and she said she doesn't have anything from her. I have given pt [name]'s number and she will contact her.

## 2023-06-06 ENCOUNTER — Other Ambulatory Visit: Payer: Self-pay

## 2023-06-06 ENCOUNTER — Emergency Department: Payer: Managed Care, Other (non HMO)

## 2023-06-06 DIAGNOSIS — R0789 Other chest pain: Secondary | ICD-10-CM | POA: Diagnosis not present

## 2023-06-06 DIAGNOSIS — R079 Chest pain, unspecified: Secondary | ICD-10-CM | POA: Diagnosis present

## 2023-06-06 LAB — CBC WITH DIFFERENTIAL/PLATELET
Abs Immature Granulocytes: 0.01 10*3/uL (ref 0.00–0.07)
Basophils Absolute: 0.1 10*3/uL (ref 0.0–0.1)
Basophils Relative: 1 %
Eosinophils Absolute: 0.1 10*3/uL (ref 0.0–0.5)
Eosinophils Relative: 2 %
HCT: 37.1 % (ref 36.0–46.0)
Hemoglobin: 11.4 g/dL — ABNORMAL LOW (ref 12.0–15.0)
Immature Granulocytes: 0 %
Lymphocytes Relative: 56 %
Lymphs Abs: 2.7 10*3/uL (ref 0.7–4.0)
MCH: 22.9 pg — ABNORMAL LOW (ref 26.0–34.0)
MCHC: 30.7 g/dL (ref 30.0–36.0)
MCV: 74.5 fL — ABNORMAL LOW (ref 80.0–100.0)
Monocytes Absolute: 0.3 10*3/uL (ref 0.1–1.0)
Monocytes Relative: 5 %
Neutro Abs: 1.7 10*3/uL (ref 1.7–7.7)
Neutrophils Relative %: 36 %
Platelets: 270 10*3/uL (ref 150–400)
RBC: 4.98 MIL/uL (ref 3.87–5.11)
RDW: 15.9 % — ABNORMAL HIGH (ref 11.5–15.5)
WBC: 4.8 10*3/uL (ref 4.0–10.5)
nRBC: 0 % (ref 0.0–0.2)

## 2023-06-06 LAB — TROPONIN I (HIGH SENSITIVITY): Troponin I (High Sensitivity): <2 ng/L (ref <18)

## 2023-06-06 LAB — BASIC METABOLIC PANEL
Anion gap: 10 (ref 5–15)
BUN: 14 mg/dL (ref 6–20)
CO2: 25 mmol/L (ref 22–32)
Calcium: 9.5 mg/dL (ref 8.9–10.3)
Chloride: 104 mmol/L (ref 98–111)
Creatinine, Ser: 1.04 mg/dL — ABNORMAL HIGH (ref 0.44–1.00)
GFR, Estimated: >60 mL/min (ref >60)
Glucose, Bld: 96 mg/dL (ref 70–99)
Potassium: 3.9 mmol/L (ref 3.5–5.1)
Sodium: 139 mmol/L (ref 135–145)

## 2023-06-06 NOTE — ED Triage Notes (Signed)
Pt c/o chest soreness that began earlier this morning as well as feeling light headed. Pt denies any known cardiac hx.

## 2023-06-07 ENCOUNTER — Emergency Department
Admission: EM | Admit: 2023-06-07 | Discharge: 2023-06-07 | Disposition: A | Discharge status: Home / Self Care | Payer: Managed Care, Other (non HMO) | Attending: Emergency Medicine | Admitting: Emergency Medicine

## 2023-06-07 ENCOUNTER — Other Ambulatory Visit: Payer: Self-pay

## 2023-06-07 DIAGNOSIS — R0789 Other chest pain: Secondary | ICD-10-CM

## 2023-06-07 LAB — TROPONIN I (HIGH SENSITIVITY): Troponin I (High Sensitivity): <2 ng/L (ref <18)

## 2023-06-07 MED ORDER — CYCLOBENZAPRINE HCL 10 MG PO TABS
10.0000 mg | ORAL_TABLET | Freq: Three times a day (TID) | ORAL | 0 refills | Status: AC | PRN
Start: 2023-06-07 — End: ?

## 2023-06-07 MED ORDER — NAPROXEN 375 MG PO TABS: 375.0000 mg | ORAL_TABLET | Freq: Two times a day (BID) | ORAL | 0 refills | Status: AC

## 2023-06-07 NOTE — ED Provider Notes (Signed)
[facility] Provider Note    Event Date/Time   First MD Initiated Contact with Patient 06/07/23 0121     (approximate)   History   Chest Pain   HPI  Kathy Jackson is a 52 y.o. female here with chest pain.  The patient states that for the last day, she has had constant, aching, positional, upper chest pain.  Is worse with certain positions.  Scribes as a sharp pain, particularly when she is changing positions or moving her arms up or pushing off of the bed.  Denies any leaving factors.  No recent illnesses.  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  No direct trauma.  No shortness of breath.  No nausea or vomiting.  No diaphoresis.     Physical Exam   Triage Vital Signs: ED Triage Vitals  Encounter Vitals Group     BP 06/06/23 2044 126/75     Systolic BP Percentile --      Diastolic BP Percentile --      Pulse Rate 06/06/23 2044 71     Resp 06/06/23 2044 18     Temp 06/06/23 2044 98.4 F (36.9 C)     Temp Source 06/06/23 2347 Oral     SpO2 06/06/23 2044 100 %     Weight 06/06/23 2046 167 lb (75.8 kg)     Height 06/06/23 2046 5\' 4"  (1.626 m)     Head Circumference --      Peak Flow --      Pain Score 06/06/23 2045 8     Pain Loc --      Pain Education --      Exclude from Growth Chart --     Most recent vital signs: Vitals:   06/07/23 0315 06/07/23 0330  BP:  107/67  Pulse:  63  Resp: 18 13  Temp:    SpO2: 100% 100%     General: Awake, no distress.  CV:  Good peripheral perfusion.  Regular rate and rhythm.  No murmurs or rubs. Resp:  Normal work of breathing.  Lungs clear to auscultation bilaterally. Abd:  No distention.  No tenderness.  No guarding rebound. Other:  Mild chest wall tenderness over the intercostal spaces along the sternal margin of the upper chest.  No bruising or deformity.   ED Results / Procedures / Treatments   Labs (all labs ordered are listed, but only abnormal results are displayed) Labs  Reviewed  BASIC METABOLIC PANEL - Abnormal; Notable for the following components:      Result Value   Creatinine, Ser 1.04 (*)    All other components within normal limits  CBC WITH DIFFERENTIAL/PLATELET - Abnormal; Notable for the following components:   Hemoglobin 11.4 (*)    MCV 74.5 (*)    MCH 22.9 (*)    RDW 15.9 (*)    All other components within normal limits  TROPONIN I (HIGH SENSITIVITY)  TROPONIN I (HIGH SENSITIVITY)     EKG    RADIOLOGY Chest x-ray: No active disease   I also independently reviewed and agree with radiologist interpretations.   PROCEDURES:  Critical Care performed: No  .1-3 Lead EKG Interpretation  Performed by: Shaune Pollack, MD Authorized by: Shaune Pollack, MD     Interpretation: normal     ECG rate:  60-80   ECG rate assessment: normal     Rhythm: sinus rhythm     Ectopy: none  Conduction: normal   Comments:     Indication: Chest pain     MEDICATIONS ORDERED IN ED: Medications - No data to display   IMPRESSION / MDM / ASSESSMENT AND PLAN / ED COURSE  I reviewed the triage vital signs and the nursing notes.                              Differential diagnosis includes, but is not limited to, atypical CP, MSK chest pain, ACS, PNA, PTX, GERD, gastritis  Patient's presentation is most consistent with acute presentation with potential threat to life or bodily function.  The patient is on the cardiac monitor to evaluate for evidence of arrhythmia and/or significant heart rate changes   Very well appearing 52 yo F here with atypical, reproducible chest pain. Suspect MSk pain with possible costochondritis. EKG is nonischemic and trop neg x 2 - do not suspect ACS. BMP unremarkable. CBC without leukocytosis or anemia. Abdomen is soft. CXR is clear. Given reproducibility on exam and reproduction of pain w/ palpation, suspect MSK pain. Will place on NSAIDs, flexeril PRN with good return precautions.   FINAL CLINICAL  IMPRESSION(S) / ED DIAGNOSES   Final diagnoses:  Chest wall pain     Rx / DC Orders   ED Discharge Orders          Ordered    naproxen (NAPROSYN) 375 MG tablet  2 times daily with meals        06/07/23 0404    cyclobenzaprine (FLEXERIL) 10 MG tablet  3 times daily PRN        06/07/23 0404             Note:  This document was prepared using Dragon voice recognition software and may include unintentional dictation errors.   Shaune Pollack, MD 06/07/23 [phone]

## 2023-07-02 ENCOUNTER — Telehealth: Payer: Self-pay | Admitting: Obstetrics and Gynecology

## 2023-07-02 NOTE — Telephone Encounter (Signed)
The patient is returning missed call for [place]. The patient is aware [facility] is out of the office. Please advise? The patient would like return call please.

## 2023-07-08 NOTE — Telephone Encounter (Signed)
Pt aware of neg [facility] results from 9/24 except MET and STK11 VUS. IBIS=23.4%/riskscore=28.2%. I confirmed pt already spoke with [facility] for results per her ins and she had. No questions for me.  She is aware of recommendations of monthly SBE, yearly CBE and mammos (dont 4/24), as well as scr breast MRI or contrast-enhanced mammo. Will call for MRI ref prn.

## 2023-11-03 ENCOUNTER — Other Ambulatory Visit: Payer: Self-pay | Admitting: Internal Medicine

## 2023-11-03 DIAGNOSIS — Z1231 Encounter for screening mammogram for malignant neoplasm of breast: Secondary | ICD-10-CM

## 2024-05-17 ENCOUNTER — Ambulatory Visit
Admission: RE | Admit: 2024-05-17 | Discharge: 2024-05-17 | Disposition: A | Discharge status: Home / Self Care | Source: Ambulatory Visit | Attending: Internal Medicine | Admitting: Internal Medicine

## 2024-05-17 DIAGNOSIS — Z1231 Encounter for screening mammogram for malignant neoplasm of breast: Secondary | ICD-10-CM | POA: Insufficient documentation
